# Patient Record
Sex: Male | Born: 1980 | Race: Black or African American | Hispanic: No | Marital: Single | Smoking: Never smoker
Health system: Southern US, Community
[De-identification: ages and names within clinical notes are randomized; demographics above are authoritative.]

## PROBLEM LIST (undated history)

## (undated) DIAGNOSIS — N189 Chronic kidney disease, unspecified: Secondary | ICD-10-CM

## (undated) DIAGNOSIS — I10 Essential (primary) hypertension: Secondary | ICD-10-CM

## (undated) DIAGNOSIS — T8859XA Other complications of anesthesia, initial encounter: Secondary | ICD-10-CM

## (undated) DIAGNOSIS — Z9289 Personal history of other medical treatment: Secondary | ICD-10-CM

## (undated) DIAGNOSIS — T4145XA Adverse effect of unspecified anesthetic, initial encounter: Secondary | ICD-10-CM

## (undated) HISTORY — DX: Chronic kidney disease, unspecified: N18.9

## (undated) HISTORY — PX: ARTERIOVENOUS GRAFT PLACEMENT: SUR1029

## (undated) HISTORY — DX: Essential (primary) hypertension: I10

## (undated) HISTORY — PX: INSERTION OF DIALYSIS CATHETER: SHX1324

## (undated) HISTORY — PX: OTHER SURGICAL HISTORY: SHX169

## (undated) HISTORY — PX: TONSILLECTOMY: SUR1361

## (undated) HISTORY — PX: AV FISTULA PLACEMENT: SHX1204

---

## 2018-04-22 ENCOUNTER — Other Ambulatory Visit: Payer: Self-pay

## 2018-04-22 DIAGNOSIS — N185 Chronic kidney disease, stage 5: Secondary | ICD-10-CM

## 2018-04-28 ENCOUNTER — Encounter: Payer: Self-pay | Admitting: *Deleted

## 2018-04-28 ENCOUNTER — Encounter: Payer: Self-pay | Admitting: Surgery

## 2018-04-28 ENCOUNTER — Ambulatory Visit (INDEPENDENT_AMBULATORY_CARE_PROVIDER_SITE_OTHER): Payer: Medicare Other | Admitting: Surgery

## 2018-04-28 ENCOUNTER — Ambulatory Visit (INDEPENDENT_AMBULATORY_CARE_PROVIDER_SITE_OTHER)
Admission: RE | Admit: 2018-04-28 | Discharge: 2018-04-28 | Disposition: A | Discharge status: Home / Self Care | Payer: Medicare Other | Source: Ambulatory Visit | Attending: Family | Admitting: Family

## 2018-04-28 ENCOUNTER — Ambulatory Visit (HOSPITAL_COMMUNITY)
Admission: RE | Admit: 2018-04-28 | Discharge: 2018-04-28 | Disposition: A | Discharge status: Home / Self Care | Payer: Medicare Other | Source: Ambulatory Visit | Attending: Family | Admitting: Family

## 2018-04-28 ENCOUNTER — Other Ambulatory Visit: Payer: Self-pay

## 2018-04-28 VITALS — BP 156/107 | HR 71 | Temp 97.6°F | Resp 20 | Ht 65.0 in | Wt 258.7 lb

## 2018-04-28 DIAGNOSIS — N186 End stage renal disease: Secondary | ICD-10-CM

## 2018-04-28 DIAGNOSIS — N185 Chronic kidney disease, stage 5: Secondary | ICD-10-CM | POA: Insufficient documentation

## 2018-04-28 DIAGNOSIS — Z992 Dependence on renal dialysis: Secondary | ICD-10-CM

## 2018-04-28 NOTE — H&P (View-Only) (Signed)
Vascular and Vein Specialist of [facility]  Patient name: John Petty MRN: [id number] DOB: 02-Jan-1981 Sex: male   REQUESTING PROVIDER:    Dr. Moshe Cipro   REASON FOR CONSULT:    Dialysis access  HISTORY OF PRESENT ILLNESS:   John Petty is a 38 y.o. male, who is referred today for dialysis access.  The patient has been on dialysis and on for [place].  According to him he is running out of options for upper extremity access.  He states that he is undergone bilateral upper extremity venogram and there was discussions on whether or not he should have a HeRO graft placed.  He moved to [place] and so none of these records are here with him today.  He currently has a right thigh graft in place  The patient is medically managed for hypertension.  He is on dialysis Tuesday Thursday Saturday.  PAST MEDICAL HISTORY    Past Medical History:  Diagnosis Date  . Chronic kidney disease   . Hypertension      FAMILY HISTORY   History reviewed. No pertinent family history.  SOCIAL HISTORY:   Social History   Socioeconomic History  . Marital status: Single    Spouse name: Not on file  . Number of children: Not on file  . Years of education: Not on file  . Highest education level: Not on file  Occupational History  . Not on file  Social Needs  . Financial resource strain: Not on file  . Food insecurity:    Worry: Not on file    Inability: Not on file  . Transportation needs:    Medical: Not on file    Non-medical: Not on file  Tobacco Use  . Smoking status: Never Smoker  . Smokeless tobacco: Never Used  Substance and Sexual Activity  . Alcohol use: Yes    Comment: occasional  . Drug use: Not Currently  . Sexual activity: Not on file  Lifestyle  . Physical activity:    Days per week: Not on file    Minutes per session: Not on file  . Stress: Not on file  Relationships  . Social connections:    Talks on phone: Not on  file    Gets together: Not on file    Attends religious service: Not on file    Active member of club or organization: Not on file    Attends meetings of clubs or organizations: Not on file    Relationship status: Not on file  . Intimate partner violence:    Fear of current or ex partner: Not on file    Emotionally abused: Not on file    Physically abused: Not on file    Forced sexual activity: Not on file  Other Topics Concern  . Not on file  Social History Narrative  . Not on file    ALLERGIES:    No Known Allergies  CURRENT MEDICATIONS:    Current Outpatient Medications  Medication Sig Dispense Refill  . amLODipine (NORVASC) 5 MG tablet Take by mouth.    Marland Kitchen lisinopril (PRINIVIL,ZESTRIL) 10 MG tablet      No current facility-administered medications for this visit.     REVIEW OF SYSTEMS:   [X]  denotes positive finding, [ ]  denotes negative finding Cardiac  Comments:  Chest pain or chest pressure:    Shortness of breath upon exertion:    Short of breath when lying flat:    Irregular heart rhythm:  Vascular    Pain in calf, thigh, or hip brought on by ambulation:    Pain in feet at night that wakes you up from your sleep:     Blood clot in your veins:    Leg swelling:         Pulmonary    Oxygen at home:    Productive cough:     Wheezing:         Neurologic    Sudden weakness in arms or legs:     Sudden numbness in arms or legs:     Sudden onset of difficulty speaking or slurred speech:    Temporary loss of vision in one eye:     Problems with dizziness:         Gastrointestinal    Blood in stool:      Vomited blood:         Genitourinary    Burning when urinating:     Blood in urine:        Psychiatric    Major depression:         Hematologic    Bleeding problems:    Problems with blood clotting too easily:        Skin    Rashes or ulcers:        Constitutional    Fever or chills:     PHYSICAL EXAM:   Vitals:   04/28/18 1529    BP: (!) 156/107  Pulse: 71  Resp: 20  Temp: 97.6 F (36.4 C)  SpO2: 100%  Weight: 258 lb 11.4 oz (117.4 kg)  Height: 5\' 5"  (1.651 m)    GENERAL: The patient is a well-nourished male, in no acute distress. The vital signs are documented above. CARDIAC: There is a regular rate and rhythm.  VASCULAR: Brisk posterior tibial Doppler signal on the left.  Minimal edema. PULMONARY: Nonlabored respirations ABDOMEN: Soft and non-tender with normal pitched bowel sounds.  MUSCULOSKELETAL: There are no major deformities or cyanosis. NEUROLOGIC: No focal weakness or paresthesias are detected. SKIN: There are no ulcers or rashes noted. PSYCHIATRIC: The patient has a normal affect.  STUDIES:   I have ordered and reviewed his vascular lab studies with the following findings: Triphasic arterial waveforms; Right Cephalic   Diameter (cm)Depth (cm)Findings +-----------------+-------------+----------+--------+ Shoulder             0.35                        +-----------------+-------------+----------+--------+ Prox upper arm       0.35                        +-----------------+-------------+----------+--------+ Mid upper arm        0.27                        +-----------------+-------------+----------+--------+ Dist upper arm    0.27 / .31                     +-----------------+-------------+----------+--------+ Antecubital fossa    0.26                        +-----------------+-------------+----------+--------+ Prox forearm         0.24                        +-----------------+-------------+----------+--------+ Mid forearm  0.19                        +-----------------+-------------+----------+--------+ Dist forearm         0.24                        +-----------------+-------------+----------+--------+  +-----------------+-------------+----------+-------------------+ Right Basilic    Diameter (cm)Depth (cm)     Findings        +-----------------+-------------+----------+-------------------+ Prox upper arm                                 graft        +-----------------+-------------+----------+-------------------+ Mid upper arm        0.49               graft and branching +-----------------+-------------+----------+-------------------+ Dist upper arm       0.32               graft and branching +-----------------+-------------+----------+-------------------+ Antecubital fossa    0.20                                   +-----------------+-------------+----------+-------------------+ Prox forearm                                 too small      +-----------------+-------------+----------+-------------------+  +-----------------+-------------+----------+--------------+ Left Cephalic    Diameter (cm)Depth (cm)   Findings    +-----------------+-------------+----------+--------------+ Prox upper arm                             thrombus    +-----------------+-------------+----------+--------------+ Mid upper arm                              thrombus    +-----------------+-------------+----------+--------------+ Dist upper arm                             thrombus    +-----------------+-------------+----------+--------------+ Antecubital fossa                       not visualized +-----------------+-------------+----------+--------------+ Prox forearm         0.13                              +-----------------+-------------+----------+--------------+ Mid forearm       0.12 / .21                           +-----------------+-------------+----------+--------------+ Dist forearm         0.20                              +-----------------+-------------+----------+--------------+  +-----------------+-------------+----------+--------------------+ Left Basilic     Diameter (cm)Depth (cm)      Findings        +-----------------+-------------+----------+--------------------+ Prox upper arm                             not visualized    +-----------------+-------------+----------+--------------------+  Mid upper arm        0.40               mis to distal. joins +-----------------+-------------+----------+--------------------+ Dist upper arm       0.41                                    +-----------------+-------------+----------+--------------------+ Antecubital fossa    0.35                                    +-----------------+-------------+----------+--------------------+ Prox forearm         0.26                                    +-----------------+-------------+----------+--------------------+  ASSESSMENT and PLAN   ESRD: This is a somewhat challenging management problem as the patient has [place] accesses [place] and potentially was being evaluated for a HeRO graft, however he is not sure if he was a candidate for this.  Therefore in order to expedite his care I am placing him on the operating room schedule for Wednesday, March 11.  My initial plan is to get records from Dr. Harrington Challenger this week so I can determine what type of HeRO location was being considered.  I will evaluate him for this in the operating room and place it if possible.  If this is not possible, I would proceed with a left thigh Gore-Tex graft.  All patient questions answered and he is in agreement with this plan.  Annamarie Major, MD V[facility] and Vein Specialists of [facility] [phone] Pager [phone]

## 2018-04-28 NOTE — Progress Notes (Signed)
Vascular and Vein Specialist of [facility]  Patient name: John Petty MRN: [id number] DOB: [birth year] Sex: male   REQUESTING PROVIDER:    Dr. Moshe Cipro   REASON FOR CONSULT:    Dialysis access  HISTORY OF PRESENT ILLNESS:   John Petty is a 38 y.o. male, who is referred today for dialysis access.  The patient has been on dialysis and on for [place].  According to him he is running out of options for upper extremity access.  He states that he is undergone bilateral upper extremity venogram and there was discussions on whether or not he should have a HeRO graft placed.  He moved to [place] and so none of these records are here with him today.  He currently has a right thigh graft in place  The patient is medically managed for hypertension.  He is on dialysis Tuesday Thursday Saturday.  PAST MEDICAL HISTORY    Past Medical History:  Diagnosis Date  . Chronic kidney disease   . Hypertension      FAMILY HISTORY   History reviewed. No pertinent family history.  SOCIAL HISTORY:   Social History   Socioeconomic History  . Marital status: Single    Spouse name: Not on file  . Number of children: Not on file  . Years of education: Not on file  . Highest education level: Not on file  Occupational History  . Not on file  Social Needs  . Financial resource strain: Not on file  . Food insecurity:    Worry: Not on file    Inability: Not on file  . Transportation needs:    Medical: Not on file    Non-medical: Not on file  Tobacco Use  . Smoking status: Never Smoker  . Smokeless tobacco: Never Used  Substance and Sexual Activity  . Alcohol use: Yes    Comment: occasional  . Drug use: Not Currently  . Sexual activity: Not on file  Lifestyle  . Physical activity:    Days per week: Not on file    Minutes per session: Not on file  . Stress: Not on file  Relationships  . Social connections:    Talks on phone: Not on  file    Gets together: Not on file    Attends religious service: Not on file    Active member of club or organization: Not on file    Attends meetings of clubs or organizations: Not on file    Relationship status: Not on file  . Intimate partner violence:    Fear of current or ex partner: Not on file    Emotionally abused: Not on file    Physically abused: Not on file    Forced sexual activity: Not on file  Other Topics Concern  . Not on file  Social History Narrative  . Not on file    ALLERGIES:    No Known Allergies  CURRENT MEDICATIONS:    Current Outpatient Medications  Medication Sig Dispense Refill  . amLODipine (NORVASC) 5 MG tablet Take by mouth.    Marland Kitchen lisinopril (PRINIVIL,ZESTRIL) 10 MG tablet      No current facility-administered medications for this visit.     REVIEW OF SYSTEMS:   [X]  denotes positive finding, [ ]  denotes negative finding Cardiac  Comments:  Chest pain or chest pressure:    Shortness of breath upon exertion:    Short of breath when lying flat:    Irregular heart rhythm:  Vascular    Pain in calf, thigh, or hip brought on by ambulation:    Pain in feet at night that wakes you up from your sleep:     Blood clot in your veins:    Leg swelling:         Pulmonary    Oxygen at home:    Productive cough:     Wheezing:         Neurologic    Sudden weakness in arms or legs:     Sudden numbness in arms or legs:     Sudden onset of difficulty speaking or slurred speech:    Temporary loss of vision in one eye:     Problems with dizziness:         Gastrointestinal    Blood in stool:      Vomited blood:         Genitourinary    Burning when urinating:     Blood in urine:        Psychiatric    Major depression:         Hematologic    Bleeding problems:    Problems with blood clotting too easily:        Skin    Rashes or ulcers:        Constitutional    Fever or chills:     PHYSICAL EXAM:   Vitals:   04/28/18 1529    BP: (!) 156/107  Pulse: 71  Resp: 20  Temp: 97.6 F (36.4 C)  SpO2: 100%  Weight: 258 lb 11.4 oz (117.4 kg)  Height: 5\' 5"  (1.651 m)    GENERAL: The patient is a well-nourished male, in no acute distress. The vital signs are documented above. CARDIAC: There is a regular rate and rhythm.  VASCULAR: Brisk posterior tibial Doppler signal on the left.  Minimal edema. PULMONARY: Nonlabored respirations ABDOMEN: Soft and non-tender with normal pitched bowel sounds.  MUSCULOSKELETAL: There are no major deformities or cyanosis. NEUROLOGIC: No focal weakness or paresthesias are detected. SKIN: There are no ulcers or rashes noted. PSYCHIATRIC: The patient has a normal affect.  STUDIES:   I have ordered and reviewed his vascular lab studies with the following findings: Triphasic arterial waveforms; Right Cephalic   Diameter (cm)Depth (cm)Findings +-----------------+-------------+----------+--------+ Shoulder             0.35                        +-----------------+-------------+----------+--------+ Prox upper arm       0.35                        +-----------------+-------------+----------+--------+ Mid upper arm        0.27                        +-----------------+-------------+----------+--------+ Dist upper arm    0.27 / .31                     +-----------------+-------------+----------+--------+ Antecubital fossa    0.26                        +-----------------+-------------+----------+--------+ Prox forearm         0.24                        +-----------------+-------------+----------+--------+ Mid forearm  0.19                        +-----------------+-------------+----------+--------+ Dist forearm         0.24                        +-----------------+-------------+----------+--------+  +-----------------+-------------+----------+-------------------+ Right Basilic    Diameter (cm)Depth (cm)     Findings        +-----------------+-------------+----------+-------------------+ Prox upper arm                                 graft        +-----------------+-------------+----------+-------------------+ Mid upper arm        0.49               graft and branching +-----------------+-------------+----------+-------------------+ Dist upper arm       0.32               graft and branching +-----------------+-------------+----------+-------------------+ Antecubital fossa    0.20                                   +-----------------+-------------+----------+-------------------+ Prox forearm                                 too small      +-----------------+-------------+----------+-------------------+  +-----------------+-------------+----------+--------------+ Left Cephalic    Diameter (cm)Depth (cm)   Findings    +-----------------+-------------+----------+--------------+ Prox upper arm                             thrombus    +-----------------+-------------+----------+--------------+ Mid upper arm                              thrombus    +-----------------+-------------+----------+--------------+ Dist upper arm                             thrombus    +-----------------+-------------+----------+--------------+ Antecubital fossa                       not visualized +-----------------+-------------+----------+--------------+ Prox forearm         0.13                              +-----------------+-------------+----------+--------------+ Mid forearm       0.12 / .21                           +-----------------+-------------+----------+--------------+ Dist forearm         0.20                              +-----------------+-------------+----------+--------------+  +-----------------+-------------+----------+--------------------+ Left Basilic     Diameter (cm)Depth (cm)      Findings        +-----------------+-------------+----------+--------------------+ Prox upper arm                             not visualized    +-----------------+-------------+----------+--------------------+  Mid upper arm        0.40               mis to distal. joins +-----------------+-------------+----------+--------------------+ Dist upper arm       0.41                                    +-----------------+-------------+----------+--------------------+ Antecubital fossa    0.35                                    +-----------------+-------------+----------+--------------------+ Prox forearm         0.26                                    +-----------------+-------------+----------+--------------------+  ASSESSMENT and PLAN   ESRD: This is a somewhat challenging management problem as the patient has had multiple accesses [place] and potentially was being evaluated for a HeRO graft, however he is not sure if he was a candidate for this.  Therefore in order to expedite his care I am placing him on the operating room schedule for Wednesday, March 11.  My initial plan is to get records from Dr. Harrington Challenger this week so I can determine what type of HeRO location was being considered.  I will evaluate him for this in the operating room and place it if possible.  If this is not possible, I would proceed with a left thigh Gore-Tex graft.  All patient questions answered and he is in agreement with this plan.  Annamarie Major, MD Vascular and Vein Specialists of [facility] [phone] Pager [phone]

## 2018-05-02 ENCOUNTER — Other Ambulatory Visit (HOSPITAL_COMMUNITY): Payer: Medicare Other

## 2018-05-02 ENCOUNTER — Encounter: Payer: Medicare Other | Admitting: Vascular Surgery

## 2018-05-02 ENCOUNTER — Encounter (HOSPITAL_COMMUNITY): Payer: Medicare Other

## 2018-05-05 ENCOUNTER — Encounter (HOSPITAL_COMMUNITY): Payer: Self-pay | Admitting: *Deleted

## 2018-05-06 ENCOUNTER — Other Ambulatory Visit: Payer: Self-pay | Admitting: *Deleted

## 2018-05-06 ENCOUNTER — Other Ambulatory Visit: Payer: Self-pay

## 2018-05-06 ENCOUNTER — Encounter (HOSPITAL_COMMUNITY): Payer: Self-pay | Admitting: *Deleted

## 2018-05-06 MED ORDER — DEXTROSE 5 % IV SOLN
3.0000 g | INTRAVENOUS | Status: AC
  Administered 2018-05-07: 3 g via INTRAVENOUS
  Filled 2018-05-06: qty 3

## 2018-05-06 NOTE — Progress Notes (Signed)
Mr Voorhies denies chest pain or shortness of breath.  Mr Cuffee moved from [place], 02/25/2018.  I have requested records from [facility], asking for an EKG, possibly from [place]. Mr Randle said if he had an EKG he felt it would have been at [facility]  [facility] [place], I called,they have not seen patient since 2017- they are outpatient surgery and do not  have an EKG. Mr Veltre denies ever  Having an ECHO , Stress Test or Cardiac Cath. Mr Richert is working on transportation home and someone to stay with him 1st 24 hours after surgery.

## 2018-05-07 ENCOUNTER — Encounter (HOSPITAL_COMMUNITY): Payer: Self-pay

## 2018-05-07 ENCOUNTER — Encounter (HOSPITAL_COMMUNITY)
Admission: RE | Disposition: A | Discharge status: Home / Self Care | Payer: Self-pay | Source: Home / Self Care | Attending: Surgery

## 2018-05-07 ENCOUNTER — Observation Stay (HOSPITAL_COMMUNITY)
Admission: RE | Admit: 2018-05-07 | Discharge: 2018-05-08 | Disposition: A | Discharge status: Home / Self Care | Payer: Medicare Other | Attending: Surgery | Admitting: Surgery

## 2018-05-07 ENCOUNTER — Ambulatory Visit (HOSPITAL_COMMUNITY): Payer: Medicare Other | Admitting: Anesthesiology

## 2018-05-07 DIAGNOSIS — N185 Chronic kidney disease, stage 5: Secondary | ICD-10-CM | POA: Diagnosis not present

## 2018-05-07 DIAGNOSIS — I120 Hypertensive chronic kidney disease with stage 5 chronic kidney disease or end stage renal disease: Principal | ICD-10-CM | POA: Insufficient documentation

## 2018-05-07 DIAGNOSIS — Z79899 Other long term (current) drug therapy: Secondary | ICD-10-CM | POA: Diagnosis not present

## 2018-05-07 DIAGNOSIS — Z992 Dependence on renal dialysis: Secondary | ICD-10-CM | POA: Insufficient documentation

## 2018-05-07 DIAGNOSIS — N186 End stage renal disease: Secondary | ICD-10-CM | POA: Insufficient documentation

## 2018-05-07 HISTORY — PX: AV FISTULA PLACEMENT: SHX1204

## 2018-05-07 HISTORY — DX: Personal history of other medical treatment: Z92.89

## 2018-05-07 HISTORY — DX: Other complications of anesthesia, initial encounter: T88.59XA

## 2018-05-07 HISTORY — DX: Adverse effect of unspecified anesthetic, initial encounter: T41.45XA

## 2018-05-07 LAB — CBC
HCT: 39.2 % (ref 39.0–52.0)
Hemoglobin: 11.1 g/dL — ABNORMAL LOW (ref 13.0–17.0)
MCH: 25.3 pg — AB (ref 26.0–34.0)
MCHC: 28.3 g/dL — ABNORMAL LOW (ref 30.0–36.0)
MCV: 89.3 fL (ref 80.0–100.0)
Platelets: 243 10*3/uL (ref 150–400)
RBC: 4.39 MIL/uL (ref 4.22–5.81)
RDW: 18 % — ABNORMAL HIGH (ref 11.5–15.5)
WBC: 11.3 10*3/uL — ABNORMAL HIGH (ref 4.0–10.5)
nRBC: 0 % (ref 0.0–0.2)

## 2018-05-07 LAB — POCT I-STAT 4, (NA,K, GLUC, HGB,HCT)
Glucose, Bld: 93 mg/dL (ref 70–99)
HCT: 38 % — ABNORMAL LOW (ref 39.0–52.0)
Hemoglobin: 12.9 g/dL — ABNORMAL LOW (ref 13.0–17.0)
Potassium: 4.4 mmol/L (ref 3.5–5.1)
Sodium: 138 mmol/L (ref 135–145)

## 2018-05-07 LAB — CREATININE, SERUM
Creatinine, Ser: 9.24 mg/dL — ABNORMAL HIGH (ref 0.61–1.24)
GFR calc Af Amer: 7 mL/min — ABNORMAL LOW (ref >60)
GFR calc non Af Amer: 6 mL/min — ABNORMAL LOW (ref >60)

## 2018-05-07 SURGERY — INSERTION OF ARTERIOVENOUS (AV) GORE-TEX GRAFT THIGH
Anesthesia: General | Site: Thigh | Laterality: Left

## 2018-05-07 MED ORDER — PHENYLEPHRINE 40 MCG/ML (10ML) SYRINGE FOR IV PUSH (FOR BLOOD PRESSURE SUPPORT)
PREFILLED_SYRINGE | INTRAVENOUS | Status: AC
  Filled 2018-05-07: qty 10

## 2018-05-07 MED ORDER — DEXAMETHASONE SODIUM PHOSPHATE 10 MG/ML IJ SOLN
INTRAMUSCULAR | Status: AC
  Filled 2018-05-07: qty 1

## 2018-05-07 MED ORDER — 0.9 % SODIUM CHLORIDE (POUR BTL) OPTIME
TOPICAL | Status: DC | PRN
  Administered 2018-05-07: 2000 mL

## 2018-05-07 MED ORDER — PHENYLEPHRINE HCL 10 MG/ML IJ SOLN
INTRAMUSCULAR | Status: DC | PRN
  Administered 2018-05-07 (×5): 80 ug via INTRAVENOUS

## 2018-05-07 MED ORDER — FENTANYL CITRATE (PF) 250 MCG/5ML IJ SOLN
INTRAMUSCULAR | Status: AC
  Filled 2018-05-07: qty 5

## 2018-05-07 MED ORDER — LABETALOL HCL 5 MG/ML IV SOLN
10.0000 mg | INTRAVENOUS | Status: DC | PRN
  Administered 2018-05-07: 10 mg via INTRAVENOUS
  Filled 2018-05-07: qty 4

## 2018-05-07 MED ORDER — CEFAZOLIN SODIUM-DEXTROSE 2-4 GM/100ML-% IV SOLN
INTRAVENOUS | Status: AC
  Filled 2018-05-07: qty 100

## 2018-05-07 MED ORDER — FENTANYL CITRATE (PF) 100 MCG/2ML IJ SOLN
INTRAMUSCULAR | Status: AC
  Administered 2018-05-07: 50 ug via INTRAVENOUS
  Filled 2018-05-07: qty 2

## 2018-05-07 MED ORDER — PROPOFOL 10 MG/ML IV BOLUS
INTRAVENOUS | Status: DC | PRN
  Administered 2018-05-07: 50 mg via INTRAVENOUS
  Administered 2018-05-07: 200 mg via INTRAVENOUS
  Administered 2018-05-07: 50 mg via INTRAVENOUS

## 2018-05-07 MED ORDER — METOPROLOL TARTRATE 5 MG/5ML IV SOLN: 2.0000 mg | INTRAVENOUS | Status: DC | PRN

## 2018-05-07 MED ORDER — CEFAZOLIN SODIUM 1 G IJ SOLR
INTRAMUSCULAR | Status: AC
  Filled 2018-05-07: qty 30

## 2018-05-07 MED ORDER — HEMOSTATIC AGENTS (NO CHARGE) OPTIME
TOPICAL | Status: DC | PRN
  Administered 2018-05-07: 1 via TOPICAL

## 2018-05-07 MED ORDER — OXYCODONE HCL 5 MG/5ML PO SOLN: 5.0000 mg | Freq: Once | ORAL | Status: AC | PRN

## 2018-05-07 MED ORDER — ONDANSETRON HCL 4 MG/2ML IJ SOLN: 4.0000 mg | Freq: Four times a day (QID) | INTRAMUSCULAR | Status: DC | PRN

## 2018-05-07 MED ORDER — SODIUM CHLORIDE 0.9 % IV SOLN
INTRAVENOUS | Status: AC
  Filled 2018-05-07: qty 1.2

## 2018-05-07 MED ORDER — PROTAMINE SULFATE 10 MG/ML IV SOLN
INTRAVENOUS | Status: DC | PRN
  Administered 2018-05-07: 25 mg via INTRAVENOUS

## 2018-05-07 MED ORDER — SODIUM CHLORIDE 0.9 % IV SOLN: 500.0000 mL | Freq: Once | INTRAVENOUS | Status: DC | PRN

## 2018-05-07 MED ORDER — DEXAMETHASONE SODIUM PHOSPHATE 10 MG/ML IJ SOLN
INTRAMUSCULAR | Status: DC | PRN
  Administered 2018-05-07: 10 mg via INTRAVENOUS

## 2018-05-07 MED ORDER — MIDAZOLAM HCL 5 MG/5ML IJ SOLN
INTRAMUSCULAR | Status: DC | PRN
  Administered 2018-05-07: 2 mg via INTRAVENOUS

## 2018-05-07 MED ORDER — HEPARIN SODIUM (PORCINE) 5000 UNIT/ML IJ SOLN
5000.0000 [IU] | Freq: Three times a day (TID) | INTRAMUSCULAR | Status: DC
  Administered 2018-05-07 – 2018-05-08 (×3): 5000 [IU] via SUBCUTANEOUS
  Filled 2018-05-07 (×4): qty 1

## 2018-05-07 MED ORDER — EPHEDRINE SULFATE 50 MG/ML IJ SOLN
INTRAMUSCULAR | Status: DC | PRN
  Administered 2018-05-07 (×4): 10 mg via INTRAVENOUS

## 2018-05-07 MED ORDER — PROTAMINE SULFATE 10 MG/ML IV SOLN
INTRAVENOUS | Status: AC
  Filled 2018-05-07: qty 5

## 2018-05-07 MED ORDER — CEFAZOLIN SODIUM-DEXTROSE 2-4 GM/100ML-% IV SOLN
2.0000 g | Freq: Once | INTRAVENOUS | Status: AC
  Administered 2018-05-08: 2 g via INTRAVENOUS
  Filled 2018-05-07: qty 100

## 2018-05-07 MED ORDER — ONDANSETRON HCL 4 MG/2ML IJ SOLN
INTRAMUSCULAR | Status: AC
  Filled 2018-05-07: qty 2

## 2018-05-07 MED ORDER — EPHEDRINE 5 MG/ML INJ
INTRAVENOUS | Status: AC
  Filled 2018-05-07: qty 10

## 2018-05-07 MED ORDER — MIDAZOLAM HCL 2 MG/2ML IJ SOLN
INTRAMUSCULAR | Status: AC
  Filled 2018-05-07: qty 2

## 2018-05-07 MED ORDER — GUAIFENESIN-DM 100-10 MG/5ML PO SYRP: 15.0000 mL | ORAL_SOLUTION | ORAL | Status: DC | PRN

## 2018-05-07 MED ORDER — PHENOL 1.4 % MT LIQD: 1.0000 | OROMUCOSAL | Status: DC | PRN

## 2018-05-07 MED ORDER — FENTANYL CITRATE (PF) 100 MCG/2ML IJ SOLN
INTRAMUSCULAR | Status: DC | PRN
  Administered 2018-05-07 (×5): 50 ug via INTRAVENOUS

## 2018-05-07 MED ORDER — HYDRALAZINE HCL 20 MG/ML IJ SOLN: 5.0000 mg | INTRAMUSCULAR | Status: DC | PRN

## 2018-05-07 MED ORDER — ACETAMINOPHEN 325 MG RE SUPP: 325.0000 mg | RECTAL | Status: DC | PRN

## 2018-05-07 MED ORDER — OXYCODONE HCL 5 MG PO TABS
5.0000 mg | ORAL_TABLET | Freq: Once | ORAL | Status: AC | PRN
  Administered 2018-05-07: 5 mg via ORAL

## 2018-05-07 MED ORDER — ACETAMINOPHEN 325 MG PO TABS: 325.0000 mg | ORAL_TABLET | ORAL | Status: DC | PRN

## 2018-05-07 MED ORDER — OXYCODONE-ACETAMINOPHEN 5-325 MG PO TABS
1.0000 | ORAL_TABLET | ORAL | Status: DC | PRN
  Administered 2018-05-07 – 2018-05-08 (×3): 2 via ORAL
  Filled 2018-05-07 (×3): qty 2

## 2018-05-07 MED ORDER — CHLORHEXIDINE GLUCONATE 4 % EX LIQD: 60.0000 mL | Freq: Once | CUTANEOUS | Status: DC

## 2018-05-07 MED ORDER — SODIUM CHLORIDE 0.9 % IV SOLN
INTRAVENOUS | Status: DC | PRN
  Administered 2018-05-07: 30 ug/min via INTRAVENOUS

## 2018-05-07 MED ORDER — FENTANYL CITRATE (PF) 100 MCG/2ML IJ SOLN
25.0000 ug | INTRAMUSCULAR | Status: DC | PRN
  Administered 2018-05-07: 50 ug via INTRAVENOUS

## 2018-05-07 MED ORDER — SODIUM CHLORIDE 0.9 % IV SOLN
INTRAVENOUS | Status: DC | PRN
  Administered 2018-05-07: 08:00:00

## 2018-05-07 MED ORDER — AMLODIPINE BESYLATE 5 MG PO TABS
5.0000 mg | ORAL_TABLET | Freq: Every day | ORAL | Status: DC
  Administered 2018-05-07 – 2018-05-08 (×2): 5 mg via ORAL
  Filled 2018-05-07 (×3): qty 1

## 2018-05-07 MED ORDER — SEVELAMER CARBONATE 800 MG PO TABS
800.0000 mg | ORAL_TABLET | Freq: Three times a day (TID) | ORAL | Status: DC
  Administered 2018-05-07 – 2018-05-08 (×3): 800 mg via ORAL
  Filled 2018-05-07 (×3): qty 1

## 2018-05-07 MED ORDER — HEPARIN SODIUM (PORCINE) 1000 UNIT/ML IJ SOLN
INTRAMUSCULAR | Status: DC | PRN
  Administered 2018-05-07: 5000 [IU] via INTRAVENOUS

## 2018-05-07 MED ORDER — OXYCODONE HCL 5 MG PO TABS
ORAL_TABLET | ORAL | Status: AC
  Administered 2018-05-07: 5 mg via ORAL
  Filled 2018-05-07: qty 1

## 2018-05-07 MED ORDER — MORPHINE SULFATE (PF) 2 MG/ML IV SOLN: 2.0000 mg | INTRAVENOUS | Status: DC | PRN

## 2018-05-07 MED ORDER — HEPARIN SODIUM (PORCINE) 1000 UNIT/ML IJ SOLN
INTRAMUSCULAR | Status: AC
  Filled 2018-05-07: qty 1

## 2018-05-07 MED ORDER — PANTOPRAZOLE SODIUM 40 MG PO TBEC
40.0000 mg | DELAYED_RELEASE_TABLET | Freq: Every day | ORAL | Status: DC
  Administered 2018-05-08: 40 mg via ORAL
  Filled 2018-05-07: qty 1

## 2018-05-07 MED ORDER — ONDANSETRON HCL 4 MG/2ML IJ SOLN
INTRAMUSCULAR | Status: DC | PRN
  Administered 2018-05-07: 4 mg via INTRAVENOUS

## 2018-05-07 MED ORDER — PROPOFOL 10 MG/ML IV BOLUS
INTRAVENOUS | Status: AC
  Filled 2018-05-07: qty 40

## 2018-05-07 MED ORDER — DOCUSATE SODIUM 100 MG PO CAPS
100.0000 mg | ORAL_CAPSULE | Freq: Every day | ORAL | Status: DC
  Administered 2018-05-08: 100 mg via ORAL
  Filled 2018-05-07: qty 1

## 2018-05-07 MED ORDER — SODIUM CHLORIDE 0.9 % IV SOLN
INTRAVENOUS | Status: DC
  Administered 2018-05-07: 08:00:00 via INTRAVENOUS

## 2018-05-07 SURGICAL SUPPLY — 39 items
CANISTER SUCT 3000ML PPV (MISCELLANEOUS) ×3 IMPLANT
CLIP VESOCCLUDE MED 6/CT (CLIP) ×3 IMPLANT
CLIP VESOCCLUDE SM WIDE 6/CT (CLIP) ×3 IMPLANT
DERMABOND ADVANCED (GAUZE/BANDAGES/DRESSINGS) ×4
DERMABOND ADVANCED .7 DNX12 (GAUZE/BANDAGES/DRESSINGS) ×1 IMPLANT
DRAPE INCISE IOBAN 66X45 STRL (DRAPES) ×3 IMPLANT
ELECT CAUTERY BLADE 6.4 (BLADE) ×2 IMPLANT
ELECT REM PT RETURN 9FT ADLT (ELECTROSURGICAL) ×3
ELECTRODE REM PT RTRN 9FT ADLT (ELECTROSURGICAL) ×1 IMPLANT
GLOVE BIOGEL PI IND STRL 7.5 (GLOVE) ×1 IMPLANT
GLOVE BIOGEL PI INDICATOR 7.5 (GLOVE) ×2
GLOVE SURG SS PI 7.5 STRL IVOR (GLOVE) ×3 IMPLANT
GOWN STRL REUS W/ TWL LRG LVL3 (GOWN DISPOSABLE) ×2 IMPLANT
GOWN STRL REUS W/ TWL XL LVL3 (GOWN DISPOSABLE) ×1 IMPLANT
GOWN STRL REUS W/TWL LRG LVL3 (GOWN DISPOSABLE) ×2
GOWN STRL REUS W/TWL XL LVL3 (GOWN DISPOSABLE) ×4
GRAFT GORETEX STRT 6X50 (Vascular Products) ×2 IMPLANT
HEMOSTAT SNOW SURGICEL 2X4 (HEMOSTASIS) ×2 IMPLANT
KIT BASIN OR (CUSTOM PROCEDURE TRAY) ×3 IMPLANT
KIT TURNOVER KIT B (KITS) ×3 IMPLANT
NS IRRIG 1000ML POUR BTL (IV SOLUTION) ×5 IMPLANT
PACK CV ACCESS (CUSTOM PROCEDURE TRAY) ×3 IMPLANT
PAD ARMBOARD 7.5X6 YLW CONV (MISCELLANEOUS) ×6 IMPLANT
PENCIL BUTTON HOLSTER BLD 10FT (ELECTRODE) ×2 IMPLANT
SPONGE LAP 18X18 RF (DISPOSABLE) ×2 IMPLANT
SUT PROLENE 5 0 C 1 36 (SUTURE) ×2 IMPLANT
SUT PROLENE 6 0 BV (SUTURE) ×9 IMPLANT
SUT SILK 2 0 SH (SUTURE) ×2 IMPLANT
SUT SILK 3 0 (SUTURE) ×2
SUT SILK 3-0 18XBRD TIE 12 (SUTURE) IMPLANT
SUT VIC AB 2-0 CT1 27 (SUTURE) ×2
SUT VIC AB 2-0 CT1 TAPERPNT 27 (SUTURE) IMPLANT
SUT VIC AB 3-0 SH 27 (SUTURE) ×4
SUT VIC AB 3-0 SH 27X BRD (SUTURE) ×1 IMPLANT
SUT VIC AB 4-0 PS2 18 (SUTURE) ×2 IMPLANT
SUT VICRYL 4-0 PS2 18IN ABS (SUTURE) ×3 IMPLANT
TOWEL GREEN STERILE (TOWEL DISPOSABLE) ×3 IMPLANT
UNDERPAD 30X30 (UNDERPADS AND DIAPERS) ×1 IMPLANT
WATER STERILE IRR 1000ML POUR (IV SOLUTION) ×3 IMPLANT

## 2018-05-07 NOTE — Consult Note (Addendum)
[facility] Nurse wound consult note Reason for Consult: numerous lesions in several body locations:  Posterior neck, axillary, inframammary, subpannicular and bilateral inguinal Wound type: inflammatory, immunosuppressive lesions, suspicious for hidradenitis suppurativa (presentation is consistent with) Pressure Injury POA: NA Measurement: numerous lesions in the aforementioned locations.  The largest lesion measures 1cm x 2.4cm x 0.2cm. Red, moist wound beds, some are indurated and with scarring in previously healed locations Wound bed:As described above Drainage (amount, consistency, odor) serous to serosanguinous in areas of increased friction Periwound:intact Dressing procedure/placement/frequency: I will provide orders for topical care using our house antimicrobial textile (InterDry Ag+) while in house, but this would not be curative. Recommend referral to either Surgery, Plastic Surgery or Dermatology for ongoing care and oversight of the lesions.  Occasionally, excision of the lesions has proven beneficial to other persons. A center for excellence for this condition [facility] [place]. Other level of care than conservative dressing suggestions exceeds the scope of [facility] practice.  [place] nursing team will not follow, but will remain available to this patient, the nursing and medical teams.  Please re-consult if needed. Thanks, Maudie Flakes, MSN, RN, [place], Ar[facility] Abbott  Pager# [phone]

## 2018-05-07 NOTE — Plan of Care (Signed)
  Problem: Education: Goal: Knowledge of General Education information will improve Description Including pain rating scale, medication(s)/side effects and non-pharmacologic comfort measures Outcome: Progressing   Problem: Health Behavior/Discharge Planning: Goal: Ability to manage health-related needs will improve Outcome: Progressing   

## 2018-05-07 NOTE — Anesthesia Preprocedure Evaluation (Addendum)
Anesthesia Evaluation  Patient identified by MRN, date of birth, ID band Patient awake    Reviewed: Allergy & Precautions, H&P , NPO status , Patient's Chart, lab work & pertinent test results  Airway Mallampati: II   Neck ROM: full    Dental   Pulmonary neg pulmonary ROS,    breath sounds clear to auscultation       Cardiovascular hypertension,  Rhythm:regular Rate:Normal     Neuro/Psych    GI/Hepatic   Endo/Other    Renal/GU ESRF and DialysisRenal disease     Musculoskeletal   Abdominal   Peds  Hematology   Anesthesia Other Findings   Reproductive/Obstetrics                            Anesthesia Physical Anesthesia Plan  ASA: III  Anesthesia Plan: General   Post-op Pain Management:    Induction: Intravenous  PONV Risk Score and Plan: 2 and Ondansetron, Midazolam, Dexamethasone and Treatment may vary due to age or medical condition  Airway Management Planned: LMA  Additional Equipment:   Intra-op Plan:   Post-operative Plan:   Informed Consent: I have reviewed the patients History and Physical, chart, labs and discussed the procedure including the risks, benefits and alternatives for the proposed anesthesia with the patient or authorized representative who has indicated his/her understanding and acceptance.       Plan Discussed with: CRNA, Anesthesiologist and Surgeon  Anesthesia Plan Comments:         Anesthesia Quick Evaluation

## 2018-05-07 NOTE — Progress Notes (Signed)
Pt has multiple open areas under bilateral axilla, bilateral breast tissue, and bilateral groin. Moisture is present to all areas with an odor. Pt sts the areas are always there, and he has never had any treatment for them. Pt denies fever at this time. Will relay to the surgeon.

## 2018-05-07 NOTE — Transfer of Care (Signed)
Immediate Anesthesia Transfer of Care Note  Patient: John Petty  Procedure(s) Performed: INSERTION OF ARTERIOVENOUS (AV) GORE-TEX GRAFT LEFT THIGH (Left Thigh)  Patient Location: PACU  Anesthesia Type:General  Level of Consciousness: awake, alert , oriented and sedated  Airway & Oxygen Therapy: Patient Spontanous Breathing and Patient connected to nasal cannula oxygen  Post-op Assessment: Report given to RN, Post -op Vital signs reviewed and stable and Patient moving all extremities  Post vital signs: Reviewed and stable  Last Vitals:  Vitals Value Taken Time  BP 142/94 05/07/2018 10:37 AM  Temp    Pulse 99 05/07/2018 10:40 AM  Resp 8 05/07/2018 10:40 AM  SpO2 98 % 05/07/2018 10:40 AM  Vitals shown include unvalidated device data.  Last Pain:  Vitals:   05/07/18 0705  TempSrc:   PainSc: 0-No pain         Complications: No apparent anesthesia complications

## 2018-05-07 NOTE — Discharge Instructions (Signed)
° °  [facility] ° °Discharge Instructions ° °AV Fistula or Graft Surgery for Dialysis Access ° °Please refer to the following instructions for your post-procedure care. Your surgeon or physician assistant will discuss any changes with you. ° °Activity ° °You may drive the day following your surgery, if you are comfortable and no longer taking prescription pain medication. Resume full activity as the soreness in your incision resolves. ° °Bathing/Showering ° °You may shower after you go home. Keep your incision dry for 48 hours. Do not soak in a bathtub, hot tub, or swim until the incision heals completely. You may not shower if you have a hemodialysis catheter. ° °Incision Care ° °Clean your incision with mild soap and water after 48 hours. Pat the area dry with a clean towel. You do not need a bandage unless otherwise instructed. Do not apply any ointments or creams to your incision. You may have skin glue on your incision. Do not peel it off. It will come off on its own in about one week. Your arm may swell a bit after surgery. To reduce swelling use pillows to elevate your arm so it is above your heart. Your doctor will tell you if you need to lightly wrap your arm with an ACE bandage. ° °Diet ° °Resume your normal diet. There are not special food restrictions following this procedure. In order to heal from your surgery, it is CRITICAL to get adequate nutrition. Your body requires vitamins, minerals, and protein. Vegetables are the best source of vitamins and minerals. Vegetables also provide the perfect balance of protein. Processed food has little nutritional value, so try to avoid this. ° °Medications ° °Resume taking all of your medications. If your incision is causing pain, you may take over-the counter pain relievers such as acetaminophen (Tylenol). If you were prescribed a stronger pain medication, please be aware these medications can cause nausea and constipation. Prevent  nausea by taking the medication with a snack or meal. Avoid constipation by drinking plenty of fluids and eating foods with high amount of fiber, such as fruits, vegetables, and grains. Do not take Tylenol if you are taking prescription pain medications. ° ° ° ° °Follow up °Your surgeon may want to see you in the office following your access surgery. If so, this will be arranged at the time of your surgery. ° °Please call us immediately for any of the following conditions: ° °Increased pain, redness, drainage (pus) from your incision site °Fever of 101 degrees or higher °Severe or worsening pain at your incision site °Hand pain or numbness. ° °Reduce your risk of vascular disease: ° °Stop smoking. If you would like help, call [facility] at 1-800-QUIT-NOW (1-800-784-8669) or [facility] at 336-586-4000 ° °Manage your cholesterol °Maintain a desired weight °Control your diabetes °Keep your blood pressure down ° °Dialysis ° °It will take several weeks to several months for your new dialysis access to be ready for use. Your surgeon will determine when it is OK to use it. Your nephrologist will continue to direct your dialysis. You can continue to use your Permcath until your new access is ready for use. ° °If you have any questions, please call the office at [phone]. ° °

## 2018-05-07 NOTE — Interval H&P Note (Signed)
History and Physical Interval Note:  05/07/2018 7:33 AM  John Petty  has presented today for surgery, with the diagnosis of END STAGE RENAL DISEASE.  The various methods of treatment have been discussed with the patient and family. After consideration of risks, benefits and other options for treatment, the patient has consented to  Procedure(s): INSERTION OF ARTERIOVENOUS (AV) GORE-TEX GRAFT LEFT THIGH (Right) as a surgical intervention.  The patient's history has been reviewed, patient examined, no change in status, stable for surgery.  I have reviewed the patient's chart and labs.  Questions were answered to the patient's satisfaction.     Annamarie Major

## 2018-05-07 NOTE — Anesthesia Procedure Notes (Signed)
Procedure Name: LMA Insertion Date/Time: 05/07/2018 8:44 AM Performed by: Scheryl Darter, CRNA Pre-anesthesia Checklist: Patient identified, Emergency Drugs available, Suction available and Patient being monitored Patient Re-evaluated:Patient Re-evaluated prior to induction Oxygen Delivery Method: Circle System Utilized Preoxygenation: Pre-oxygenation with 100% oxygen Induction Type: IV induction Ventilation: Mask ventilation without difficulty LMA: LMA inserted LMA Size: 5.0 Number of attempts: 1 Placement Confirmation: positive ETCO2 Tube secured with: Tape Dental Injury: Teeth and Oropharynx as per pre-operative assessment

## 2018-05-07 NOTE — Progress Notes (Signed)
Dr. Trula Slade shown the multiple open areas to the pt's skin, and sts that he will proceed with the surgery.

## 2018-05-07 NOTE — Op Note (Signed)
° ° °  Patient name: John Petty MRN: [id number] DOB: June 05, 1980 Sex: male  05/07/2018 Pre-operative Diagnosis: ESRD Post-operative diagnosis:  Same Surgeon:  Annamarie Major Assistants:  Arlee Muslim Procedure:   Left thigh dialysis graft. Anesthesia: General Blood Loss: 100 cc Specimens: None  Findings: Multiple lymph nodes in the groin.  The arterial anastomosis was to the superficial femoral artery which was disease-free.  The venous anastomosis was at the saphenofemoral junction.  I ligated the saphenous vein.  Indications: The patient is here today for new access.  Most of his access has been performed in [facility].  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  Therefore, we have decided to proceed with left thigh loop graft.  Procedure:  The patient was identified in the holding area and taken to [place] [address] [address]  The patient was then placed supine on the table. general anesthesia was administered.  The patient was prepped and draped in the usual sterile fashion.  A time out was called and antibiotics were administered.  Ultrasound was used to evaluate the femoral vessels to determine the appropriate location the incision.  A longitudinal incision was made in the left groin.  Cautery was used divide subtenons tissue down the femoral sheath which was opened sharply.  I then dissected out the saphenous vein.  There were multiple bulbous areas that made me think the saphenous vein was not the best venous target but rather the saphenofemoral junction.  I then dissected out the femoral vein which was a healthy 15 mm vein.  Next, the common femoral superficial femoral artery were dissected free.  Because the superficial femoral artery was disease-free I elected to use this.  Next a subcutaneous tunnel was create, using a counterincision in the groin.  A 6 mm Gore-Tex graft was brought through the tunnel.  The  patient was fully heparinized.  After the heparin circulated, the left superficial femoral artery was occluded with vascular clamps.  A #11 blade was used to make an arteriotomy which extended longitudinally Potts scissors.  The graft was beveled to fit the size of the arteriotomy and a running anastomosis created 6-0 Prolene.  Once this was completed there was excellent pulsatile flow through the graft.  At this point, I divided the saphenous vein.  A Cooley clamp was placed over the saphenofemoral junction.  I opened up the saphenofemoral junction and removed the valve at this area.  A end-to-side anastomosis was created between the graft and the vein at the saphenofemoral junction with running 6-0 Prolene.  Prior to completion the appropriate flushing maneuvers were performed and the anastomosis was completed.  Next, 50 mg of protamine was administered to reverse the heparin.  Once I was satisfied hemostasis, the counterincision in the groin was closed with 304 0 Vicryl.  The groin incision was closed with multiple layers of 2-0 and 3-0 Vicryl followed by Dermabond.  There were no immediate complications.   Disposition:  To PACU stable   V. Annamarie Major, M.D. [facility] Office: [phone] Pager:  [phone]

## 2018-05-08 ENCOUNTER — Encounter (HOSPITAL_COMMUNITY): Payer: Self-pay | Admitting: Surgery

## 2018-05-08 ENCOUNTER — Telehealth: Payer: Self-pay | Admitting: Surgery

## 2018-05-08 DIAGNOSIS — I120 Hypertensive chronic kidney disease with stage 5 chronic kidney disease or end stage renal disease: Secondary | ICD-10-CM | POA: Diagnosis not present

## 2018-05-08 LAB — BASIC METABOLIC PANEL
Anion gap: 14 (ref 5–15)
BUN: 45 mg/dL — ABNORMAL HIGH (ref 6–20)
CHLORIDE: 97 mmol/L — AB (ref 98–111)
CO2: 25 mmol/L (ref 22–32)
Calcium: 8.9 mg/dL (ref 8.9–10.3)
Creatinine, Ser: 10.35 mg/dL — ABNORMAL HIGH (ref 0.61–1.24)
GFR calc Af Amer: 7 mL/min — ABNORMAL LOW (ref >60)
GFR calc non Af Amer: 6 mL/min — ABNORMAL LOW (ref >60)
Glucose, Bld: 124 mg/dL — ABNORMAL HIGH (ref 70–99)
Potassium: 5.1 mmol/L (ref 3.5–5.1)
Sodium: 136 mmol/L (ref 135–145)

## 2018-05-08 LAB — CBC
HCT: 39 % (ref 39.0–52.0)
HEMOGLOBIN: 11 g/dL — AB (ref 13.0–17.0)
MCH: 25.1 pg — ABNORMAL LOW (ref 26.0–34.0)
MCHC: 28.2 g/dL — ABNORMAL LOW (ref 30.0–36.0)
MCV: 88.8 fL (ref 80.0–100.0)
Platelets: 264 10*3/uL (ref 150–400)
RBC: 4.39 MIL/uL (ref 4.22–5.81)
RDW: 18.1 % — ABNORMAL HIGH (ref 11.5–15.5)
WBC: 9 10*3/uL (ref 4.0–10.5)
nRBC: 0 % (ref 0.0–0.2)

## 2018-05-08 MED ORDER — OXYCODONE-ACETAMINOPHEN 5-325 MG PO TABS: 1.0000 | ORAL_TABLET | Freq: Four times a day (QID) | ORAL | 0 refills | Status: DC | PRN

## 2018-05-08 MED ORDER — HEPARIN SODIUM (PORCINE) 1000 UNIT/ML IJ SOLN
2600.0000 [IU] | Freq: Once | INTRAMUSCULAR | Status: AC
  Administered 2018-05-08: 2600 [IU] via INTRAVENOUS

## 2018-05-08 NOTE — Discharge Summary (Signed)
Physician Discharge Summary   Patient ID: John Petty [id number] 38 y.o. 06-Sep-1980  Admit date: 05/07/2018  Discharge date and time: 05/08/18   Admitting Physician: Serafina Mitchell, MD   Discharge Physician: same  Admission Diagnoses: END STAGE RENAL DISEASE  Discharge Diagnoses: same  Admission Condition: fair  Discharged Condition: fair  Indication for Admission: need for permanent dialysis access; observation overnight post-op  Hospital Course: [name] is a 38 year old male who was brought in as an outpatient for placement of left thigh dialysis graft by Dr. Trula Slade on 05/07/2018.  He was kept overnight in extended recovery/observation.  POD #1 left thigh graft was patent any still had a palpable left DP pulse.  Patient states he was unable to get a ride to his outpatient dialysis center today.  I spoke with a nephrology PA who called his [facility] and arranged for patient to dialyze tomorrow 05/09/2018 off schedule.  He will be prescribed 1 to 2 days of narcotic pain medication for continued postoperative pain control.  He will follow-up in office in about 2 weeks for incision check and graft patency.  Discharge instructions were reviewed the patient.  Dialysis will be able to access left thigh graft in 4 weeks.  He will be discharged home this afternoon in stable condition.  Consults: None  Treatments: surgery: Placement of left thigh arteriovenous dialysis graft by Dr. Trula Slade on 05/07/2018.  Discharge Exam: see progress note 05/08/18 Vitals:   05/08/18 1014 05/08/18 1242  BP: (!) 155/105 126/68  Pulse:    Resp:  (!) 23  Temp: 98.2 F (36.8 C) 97.6 F (36.4 C)  SpO2: 100%     Disposition: Discharge disposition: 01-Home or Self Care       Patient Instructions:  Allergies as of 05/08/2018   No Known Allergies     Medication List    TAKE these medications   amLODipine 5 MG tablet Commonly known as:  NORVASC Take 5 mg by mouth daily.  Usually take in the evening   oxyCODONE-acetaminophen 5-325 MG tablet Commonly known as:  PERCOCET/ROXICET Take 1 tablet by mouth every 6 (six) hours as needed for moderate pain.   sevelamer carbonate 800 MG tablet Commonly known as:  RENVELA Take 800 mg by mouth 3 (three) times daily with meals. 4 with meals and 3 with snacks      Activity: activity as tolerated Diet: regular diet Wound Care: keep wound clean and dry  Follow-up with Dr. Trula Slade in 2 weeks.  SignedDagoberto Ligas 05/08/2018 2:10 PM

## 2018-05-08 NOTE — Telephone Encounter (Signed)
Called and spoke with patient advised him of his appt on 05/26/18 @ 8:30 with Dr. Trula Slade.  Will mail follow up paperwork and reminder letter.

## 2018-05-08 NOTE — Anesthesia Postprocedure Evaluation (Signed)
Anesthesia Post Note  Patient: John Petty  Procedure(s) Performed: INSERTION OF ARTERIOVENOUS (AV) GORE-TEX GRAFT LEFT THIGH (Left Thigh)     Patient location during evaluation: PACU Anesthesia Type: General Level of consciousness: awake and alert Pain management: pain level controlled Vital Signs Assessment: post-procedure vital signs reviewed and stable Respiratory status: spontaneous breathing, nonlabored ventilation, respiratory function stable and patient connected to nasal cannula oxygen Cardiovascular status: blood pressure returned to baseline and stable Postop Assessment: no apparent nausea or vomiting Anesthetic complications: no    Last Vitals:  Vitals:   05/08/18 0515 05/08/18 1014  BP: (!) 155/97 (!) 155/105  Pulse: 72   Resp: 20   Temp: 36.5 C 36.8 C  SpO2: 100% 100%    Last Pain:  Vitals:   05/08/18 1014  TempSrc: Oral  PainSc:                  Alianza S

## 2018-05-08 NOTE — Progress Notes (Addendum)
  Progress Note    05/08/2018 10:18 AM 1 Day Post-Op  Subjective:  Anticipating discharge today.   Vitals:   05/08/18 0515 05/08/18 1014  BP: (!) 155/97 (!) 155/105  Pulse: 72   Resp: 20   Temp: 97.7 F (36.5 C) 98.2 F (36.8 C)  SpO2: 100% 100%   Physical Exam: Lungs:  Non labored Incisions:  L groin incisions c/d/i Extremities:  Palpable graft thrill; palpable L DP Abdomen:  Soft Neurologic: A&O  CBC    Component Value Date/Time   WBC 9.0 05/08/2018 0423   RBC 4.39 05/08/2018 0423   HGB 11.0 (L) 05/08/2018 0423   HCT 39.0 05/08/2018 0423   PLT 264 05/08/2018 0423   MCV 88.8 05/08/2018 0423   MCH 25.1 (L) 05/08/2018 0423   MCHC 28.2 (L) 05/08/2018 0423   RDW 18.1 (H) 05/08/2018 0423    BMET    Component Value Date/Time   NA 136 05/08/2018 0423   K 5.1 05/08/2018 0423   CL 97 (L) 05/08/2018 0423   CO2 25 05/08/2018 0423   GLUCOSE 124 (H) 05/08/2018 0423   BUN 45 (H) 05/08/2018 0423   CREATININE 10.35 (H) 05/08/2018 0423   CALCIUM 8.9 05/08/2018 0423   GFRNONAA 6 (L) 05/08/2018 0423   GFRAA 7 (L) 05/08/2018 0423    INR No results found for: INR   Intake/Output Summary (Last 24 hours) at 05/08/2018 1018 Last data filed at 05/07/2018 1040 Gross per 24 hour  Intake 100 ml  Output -  Net 100 ml     Assessment/Plan:  38 y.o. male is s/p L thigh AVG 1 Day Post-Op   Patent thigh graft Palpable L DP Ok for discharge Nephrology arranged dialysis tomorrow at outpatient center first shift   Dagoberto Ligas, PA-C [facility] [phone] 05/08/2018 10:18 AM  Agree with the above.  Anticipate d/c later today  WElls BRabham

## 2018-05-08 NOTE — Progress Notes (Signed)
IV team capped off HD cath for discharge. Telemetry discontinued. Discharge instructions reviewed with patient. All questions answered.   Emelda Fear, RN

## 2018-05-13 ENCOUNTER — Telehealth: Payer: Self-pay | Admitting: *Deleted

## 2018-05-13 ENCOUNTER — Other Ambulatory Visit: Payer: Self-pay | Admitting: *Deleted

## 2018-05-13 NOTE — Progress Notes (Signed)
Spoke with patient and instructed to be at [facility] admitting at 7 am on 05/15/2018 for surgery. No food or drink past MN night prior. Expect a phone call and follow the detailed instructions received from the hospital pre-admission department. They will give instructions regarding medications to take am of surgery. Must have a driver and caregiver for discharge. Verbalized understanding. Reminded him that [employer] will call him regarding re-scheduling of HD days.

## 2018-05-13 NOTE — Telephone Encounter (Signed)
Return call to [place] at [facility]. Will schedule patient for Thrmobectomy of A/V graft for 05/08/2018 7:30 am with Dr. Trula Slade per his instructions. They will arrange HD to accommodate his Thursday surgery.

## 2018-05-14 ENCOUNTER — Encounter (HOSPITAL_COMMUNITY): Payer: Self-pay | Admitting: *Deleted

## 2018-05-14 ENCOUNTER — Other Ambulatory Visit: Payer: Self-pay

## 2018-05-14 NOTE — Progress Notes (Signed)
Patient verbalized understanding of all pre-op instructions.

## 2018-05-14 NOTE — Anesthesia Preprocedure Evaluation (Signed)
Anesthesia Evaluation  Patient identified by MRN, date of birth, ID band Patient awake    Reviewed: Allergy & Precautions, H&P , NPO status , Patient's Chart, lab work & pertinent test results  History of Anesthesia Complications (+) history of anesthetic complications  Airway Mallampati: II   Neck ROM: full    Dental   Pulmonary neg pulmonary ROS,    breath sounds clear to auscultation       Cardiovascular hypertension,  Rhythm:regular Rate:Normal     Neuro/Psych    GI/Hepatic   Endo/Other    Renal/GU ESRF and DialysisRenal disease     Musculoskeletal   Abdominal   Peds  Hematology   Anesthesia Other Findings   Reproductive/Obstetrics                             Anesthesia Physical  Anesthesia Plan  ASA: III  Anesthesia Plan: General   Post-op Pain Management:    Induction: Intravenous  PONV Risk Score and Plan: 2 and Ondansetron, Midazolam, Dexamethasone and Treatment may vary due to age or medical condition  Airway Management Planned: LMA  Additional Equipment: None  Intra-op Plan:   Post-operative Plan: Extubation in OR  Informed Consent: I have reviewed the patients History and Physical, chart, labs and discussed the procedure including the risks, benefits and alternatives for the proposed anesthesia with the patient or authorized representative who has indicated his/her understanding and acceptance.     Dental advisory given  Plan Discussed with: CRNA  Anesthesia Plan Comments:         Anesthesia Quick Evaluation

## 2018-05-14 NOTE — Progress Notes (Signed)
Spoke with patient regarding pre-op instructions.  Patient denies chest pain or SOB.  Hx HTN - no other cardiac history. PCP -  Cardiologist -denies  Chest x-ray denies EKG -denies  Stress Test - denies ECHO - denies Cardiac Cath - denies  Sleep Study - N/A CPAP -   Patient instructed to stop taking now until after surgery - no vitamins, herbal supplements, Ibuprofen/NSAIDs, BC, Goody powder. Patient instructed not to take renal medication on DOS.     Coronavirus Screening  Have you or your Brother Erlene Quan experienced the following symptoms:  Cough yes/no: No Fever (>100.50F)  yes/no: No Congestion yes/no: No Sore throat yes/no: No  Nausea/Vomiting:  No Difficulty breathing/shortness of breath  yes/no: No  Have you or your brother Erlene Quan traveled in the last 14 days and where? yes/no: No

## 2018-05-15 ENCOUNTER — Encounter (HOSPITAL_COMMUNITY): Payer: Self-pay | Admitting: *Deleted

## 2018-05-15 ENCOUNTER — Ambulatory Visit (HOSPITAL_COMMUNITY)
Admission: RE | Admit: 2018-05-15 | Discharge: 2018-05-15 | Disposition: A | Discharge status: Home / Self Care | Payer: Medicare Other | Attending: Surgery | Admitting: Surgery

## 2018-05-15 ENCOUNTER — Other Ambulatory Visit: Payer: Self-pay

## 2018-05-15 ENCOUNTER — Ambulatory Visit (HOSPITAL_COMMUNITY): Payer: Medicare Other | Admitting: Certified Registered Nurse Anesthetist

## 2018-05-15 ENCOUNTER — Encounter (HOSPITAL_COMMUNITY)
Admission: RE | Disposition: A | Discharge status: Home / Self Care | Payer: Self-pay | Source: Home / Self Care | Attending: Surgery

## 2018-05-15 DIAGNOSIS — T82868A Thrombosis of vascular prosthetic devices, implants and grafts, initial encounter: Secondary | ICD-10-CM | POA: Insufficient documentation

## 2018-05-15 DIAGNOSIS — Z79899 Other long term (current) drug therapy: Secondary | ICD-10-CM | POA: Insufficient documentation

## 2018-05-15 DIAGNOSIS — I120 Hypertensive chronic kidney disease with stage 5 chronic kidney disease or end stage renal disease: Secondary | ICD-10-CM | POA: Diagnosis not present

## 2018-05-15 DIAGNOSIS — T82898A Other specified complication of vascular prosthetic devices, implants and grafts, initial encounter: Secondary | ICD-10-CM | POA: Diagnosis present

## 2018-05-15 DIAGNOSIS — Y832 Surgical operation with anastomosis, bypass or graft as the cause of abnormal reaction of the patient, or of later complication, without mention of misadventure at the time of the procedure: Secondary | ICD-10-CM | POA: Insufficient documentation

## 2018-05-15 DIAGNOSIS — N186 End stage renal disease: Secondary | ICD-10-CM | POA: Insufficient documentation

## 2018-05-15 HISTORY — PX: THROMBECTOMY AND REVISION OF ARTERIOVENTOUS (AV) GORETEX  GRAFT: SHX6120

## 2018-05-15 LAB — POCT I-STAT 4, (NA,K, GLUC, HGB,HCT)
Glucose, Bld: 91 mg/dL (ref 70–99)
HCT: 37 % — ABNORMAL LOW (ref 39.0–52.0)
Hemoglobin: 12.6 g/dL — ABNORMAL LOW (ref 13.0–17.0)
Potassium: 5.5 mmol/L — ABNORMAL HIGH (ref 3.5–5.1)
Sodium: 134 mmol/L — ABNORMAL LOW (ref 135–145)

## 2018-05-15 SURGERY — THROMBECTOMY AND REVISION OF ARTERIOVENTOUS (AV) GORETEX  GRAFT
Anesthesia: General | Laterality: Left

## 2018-05-15 MED ORDER — PROPOFOL 10 MG/ML IV BOLUS
INTRAVENOUS | Status: DC | PRN
  Administered 2018-05-15: 200 mg via INTRAVENOUS

## 2018-05-15 MED ORDER — OXYCODONE-ACETAMINOPHEN 5-325 MG PO TABS: 1.0000 | ORAL_TABLET | Freq: Four times a day (QID) | ORAL | 0 refills | Status: AC | PRN

## 2018-05-15 MED ORDER — LIDOCAINE 2% (20 MG/ML) 5 ML SYRINGE
INTRAMUSCULAR | Status: DC | PRN
  Administered 2018-05-15: 100 mg via INTRAVENOUS

## 2018-05-15 MED ORDER — SODIUM CHLORIDE 0.9 % IV SOLN
INTRAVENOUS | Status: DC | PRN
  Administered 2018-05-15: 50 ug/min via INTRAVENOUS

## 2018-05-15 MED ORDER — PHENYLEPHRINE 40 MCG/ML (10ML) SYRINGE FOR IV PUSH (FOR BLOOD PRESSURE SUPPORT)
PREFILLED_SYRINGE | INTRAVENOUS | Status: DC | PRN
  Administered 2018-05-15 (×5): 80 ug via INTRAVENOUS

## 2018-05-15 MED ORDER — SODIUM CHLORIDE 0.9 % IV SOLN
INTRAVENOUS | Status: AC
  Filled 2018-05-15: qty 1.2

## 2018-05-15 MED ORDER — FENTANYL CITRATE (PF) 100 MCG/2ML IJ SOLN
25.0000 ug | INTRAMUSCULAR | Status: DC | PRN
  Administered 2018-05-15: 25 ug via INTRAVENOUS

## 2018-05-15 MED ORDER — CHLORHEXIDINE GLUCONATE 4 % EX LIQD: 60.0000 mL | Freq: Once | CUTANEOUS | Status: DC

## 2018-05-15 MED ORDER — PHENYLEPHRINE 40 MCG/ML (10ML) SYRINGE FOR IV PUSH (FOR BLOOD PRESSURE SUPPORT)
PREFILLED_SYRINGE | INTRAVENOUS | Status: AC
  Filled 2018-05-15: qty 10

## 2018-05-15 MED ORDER — PROPOFOL 10 MG/ML IV BOLUS
INTRAVENOUS | Status: AC
  Filled 2018-05-15: qty 20

## 2018-05-15 MED ORDER — MIDAZOLAM HCL 2 MG/2ML IJ SOLN
INTRAMUSCULAR | Status: AC
  Filled 2018-05-15: qty 2

## 2018-05-15 MED ORDER — PROMETHAZINE HCL 25 MG/ML IJ SOLN: 6.2500 mg | INTRAMUSCULAR | Status: DC | PRN

## 2018-05-15 MED ORDER — ONDANSETRON HCL 4 MG/2ML IJ SOLN
INTRAMUSCULAR | Status: AC
  Filled 2018-05-15: qty 2

## 2018-05-15 MED ORDER — MIDAZOLAM HCL 5 MG/5ML IJ SOLN
INTRAMUSCULAR | Status: DC | PRN
  Administered 2018-05-15: 2 mg via INTRAVENOUS

## 2018-05-15 MED ORDER — DEXAMETHASONE SODIUM PHOSPHATE 10 MG/ML IJ SOLN
INTRAMUSCULAR | Status: AC
  Filled 2018-05-15: qty 1

## 2018-05-15 MED ORDER — CEFAZOLIN SODIUM-DEXTROSE 2-4 GM/100ML-% IV SOLN
2.0000 g | INTRAVENOUS | Status: AC
  Administered 2018-05-15: 2 g via INTRAVENOUS
  Filled 2018-05-15: qty 100

## 2018-05-15 MED ORDER — FENTANYL CITRATE (PF) 250 MCG/5ML IJ SOLN
INTRAMUSCULAR | Status: AC
  Filled 2018-05-15: qty 5

## 2018-05-15 MED ORDER — MEPERIDINE HCL 50 MG/ML IJ SOLN: 6.2500 mg | INTRAMUSCULAR | Status: DC | PRN

## 2018-05-15 MED ORDER — HEPARIN SODIUM (PORCINE) 1000 UNIT/ML IJ SOLN
INTRAMUSCULAR | Status: DC | PRN
  Administered 2018-05-15: 5000 [IU] via INTRAVENOUS

## 2018-05-15 MED ORDER — SODIUM CHLORIDE 0.9 % IV SOLN
INTRAVENOUS | Status: DC | PRN
  Administered 2018-05-15: 09:00:00

## 2018-05-15 MED ORDER — SODIUM CHLORIDE 0.9 % IV SOLN
INTRAVENOUS | Status: DC
  Administered 2018-05-15: 08:00:00 via INTRAVENOUS

## 2018-05-15 MED ORDER — FENTANYL CITRATE (PF) 100 MCG/2ML IJ SOLN
INTRAMUSCULAR | Status: AC
  Filled 2018-05-15: qty 2

## 2018-05-15 MED ORDER — HEPARIN SODIUM (PORCINE) 1000 UNIT/ML IJ SOLN
INTRAMUSCULAR | Status: AC
  Filled 2018-05-15: qty 1

## 2018-05-15 MED ORDER — ONDANSETRON HCL 4 MG/2ML IJ SOLN
INTRAMUSCULAR | Status: DC | PRN
  Administered 2018-05-15: 4 mg via INTRAVENOUS

## 2018-05-15 MED ORDER — 0.9 % SODIUM CHLORIDE (POUR BTL) OPTIME
TOPICAL | Status: DC | PRN
  Administered 2018-05-15: 1000 mL

## 2018-05-15 MED ORDER — DEXAMETHASONE SODIUM PHOSPHATE 10 MG/ML IJ SOLN
INTRAMUSCULAR | Status: DC | PRN
  Administered 2018-05-15: 5 mg via INTRAVENOUS

## 2018-05-15 MED ORDER — LIDOCAINE 2% (20 MG/ML) 5 ML SYRINGE
INTRAMUSCULAR | Status: AC
  Filled 2018-05-15: qty 5

## 2018-05-15 MED ORDER — FENTANYL CITRATE (PF) 100 MCG/2ML IJ SOLN
INTRAMUSCULAR | Status: DC | PRN
  Administered 2018-05-15 (×6): 25 ug via INTRAVENOUS

## 2018-05-15 MED ORDER — HEMOSTATIC AGENTS (NO CHARGE) OPTIME
TOPICAL | Status: DC | PRN
  Administered 2018-05-15: 1 via TOPICAL

## 2018-05-15 SURGICAL SUPPLY — 39 items
ARMBAND PINK RESTRICT EXTREMIT (MISCELLANEOUS) ×6 IMPLANT
CANISTER SUCT 3000ML PPV (MISCELLANEOUS) ×3 IMPLANT
CATH EMB 4FR 80CM (CATHETERS) ×6 IMPLANT
CLIP VESOCCLUDE MED 6/CT (CLIP) ×3 IMPLANT
CLIP VESOCCLUDE SM WIDE 6/CT (CLIP) ×3 IMPLANT
COVER WAND RF STERILE (DRAPES) ×3 IMPLANT
DERMABOND ADVANCED (GAUZE/BANDAGES/DRESSINGS) ×2
DERMABOND ADVANCED .7 DNX12 (GAUZE/BANDAGES/DRESSINGS) ×1 IMPLANT
DRESSING PREVENA PLUS CUSTOM (GAUZE/BANDAGES/DRESSINGS) ×1 IMPLANT
DRSG PREVENA PLUS CUSTOM (GAUZE/BANDAGES/DRESSINGS) ×3
ELECT REM PT RETURN 9FT ADLT (ELECTROSURGICAL) ×3
ELECTRODE REM PT RTRN 9FT ADLT (ELECTROSURGICAL) ×1 IMPLANT
GLOVE BIO SURGEON STRL SZ7 (GLOVE) ×3 IMPLANT
GLOVE BIOGEL PI IND STRL 6.5 (GLOVE) ×2 IMPLANT
GLOVE BIOGEL PI IND STRL 7.5 (GLOVE) ×2 IMPLANT
GLOVE BIOGEL PI INDICATOR 6.5 (GLOVE) ×4
GLOVE BIOGEL PI INDICATOR 7.5 (GLOVE) ×4
GLOVE SURG SS PI 6.5 STRL IVOR (GLOVE) ×3 IMPLANT
GLOVE SURG SS PI 7.5 STRL IVOR (GLOVE) ×3 IMPLANT
GOWN STRL REUS W/ TWL LRG LVL3 (GOWN DISPOSABLE) ×2 IMPLANT
GOWN STRL REUS W/ TWL XL LVL3 (GOWN DISPOSABLE) ×1 IMPLANT
GOWN STRL REUS W/TWL LRG LVL3 (GOWN DISPOSABLE) ×4
GOWN STRL REUS W/TWL XL LVL3 (GOWN DISPOSABLE) ×2
HEMOSTAT SNOW SURGICEL 2X4 (HEMOSTASIS) ×3 IMPLANT
KIT BASIN OR (CUSTOM PROCEDURE TRAY) ×3 IMPLANT
KIT DRSG PREVENA PLUS 7DAY 125 (MISCELLANEOUS) ×3 IMPLANT
KIT TURNOVER KIT B (KITS) ×3 IMPLANT
NS IRRIG 1000ML POUR BTL (IV SOLUTION) ×3 IMPLANT
PACK CV ACCESS (CUSTOM PROCEDURE TRAY) ×3 IMPLANT
PAD ARMBOARD 7.5X6 YLW CONV (MISCELLANEOUS) ×6 IMPLANT
SPONGE LAP 18X18 X RAY DECT (DISPOSABLE) ×3 IMPLANT
SUT PROLENE 6 0 BV (SUTURE) ×6 IMPLANT
SUT VIC AB 3-0 SH 27 (SUTURE) ×2
SUT VIC AB 3-0 SH 27X BRD (SUTURE) ×1 IMPLANT
SUT VICRYL 4-0 PS2 18IN ABS (SUTURE) IMPLANT
SYRINGE 3CC LL L/F (MISCELLANEOUS) ×3 IMPLANT
TOWEL GREEN STERILE (TOWEL DISPOSABLE) ×3 IMPLANT
UNDERPAD 30X30 (UNDERPADS AND DIAPERS) ×3 IMPLANT
WATER STERILE IRR 1000ML POUR (IV SOLUTION) ×3 IMPLANT

## 2018-05-15 NOTE — Transfer of Care (Signed)
Immediate Anesthesia Transfer of Care Note  Patient: John Petty  Procedure(s) Performed: THROMBECTOMY OF ARTERIOVENTOUS (AV) GORETEX  GRAFT LEFT THIGH (Left )  Patient Location: PACU  Anesthesia Type:General  Level of Consciousness: awake, alert  and oriented  Airway & Oxygen Therapy: Patient Spontanous Breathing and Patient connected to face mask oxygen  Post-op Assessment: Report given to RN and Post -op Vital signs reviewed and stable  Post vital signs: Reviewed and stable  Last Vitals:  Vitals Value Taken Time  BP 122/82 05/15/2018 11:32 AM  Temp    Pulse 77 05/15/2018 11:34 AM  Resp 18 05/15/2018 11:34 AM  SpO2 100 % 05/15/2018 11:34 AM  Vitals shown include unvalidated device data.  Last Pain:  Vitals:   05/15/18 0731  TempSrc:   PainSc: 3       Patients Stated Pain Goal: 3 (57/89/78 4784)  Complications: No apparent anesthesia complications

## 2018-05-15 NOTE — H&P (Signed)
   Patient name: John Petty MRN: [id number] DOB: [birth year] Sex: male    HISTORY OF PRESENT ILLNESS:   John Petty is a 38 y.o. male s/p left thigh AVGG a few days ago that has now clotted  CURRENT MEDICATIONS:    Current Facility-Administered Medications  Medication Dose Route Frequency Provider Last Rate Last Dose  . 0.9 %  sodium chloride infusion   Intravenous Continuous Serafina Mitchell, MD 10 mL/hr at 05/15/18 0735    . 0.9 % irrigation (POUR BTL)    PRN Serafina Mitchell, MD   1,000 mL at 05/15/18 0911  . ceFAZolin (ANCEF) IVPB 2g/100 mL premix  2 g Intravenous 30 min Pre-Op Serafina Mitchell, MD      . chlorhexidine (HIBICLENS) 4 % liquid 4 application  60 mL Topical Once Serafina Mitchell, MD       And  . Derrill Memo ON 05/16/2018] chlorhexidine (HIBICLENS) 4 % liquid 4 application  60 mL Topical Once Serafina Mitchell, MD      . heparin 6,000 Units in sodium chloride 0.9 % 500 mL irrigation    PRN Serafina Mitchell, MD        REVIEW OF SYSTEMS:   [X]  denotes positive finding, [ ]  denotes negative finding Cardiac  Comments:  Chest pain or chest pressure:    Shortness of breath upon exertion:    Short of breath when lying flat:    Irregular heart rhythm:    Constitutional    Fever or chills:      PHYSICAL EXAM:   Vitals:   05/14/18 1024 05/15/18 0715 05/15/18 0731  BP:  136/83   Pulse:  87   Resp:  18   Temp:  98.7 F (37.1 C)   TempSrc:  Oral   SpO2:  100%   Weight: 52.4 kg  52.4 kg  Height: 5\' 5"  (1.651 m)  5\' 5"  (1.651 m)    GENERAL: The patient is a well-nourished male, in no acute distress. The vital signs are documented above. CARDIOVASCULAR: There is a regular rate and rhythm. PULMONARY: Non-labored respirations   STUDIES:      MEDICAL ISSUES:   Will attempt to thrombectomize graft  Leia Alf, MD, FACS [facility] [phone] Pager [phone]

## 2018-05-15 NOTE — Anesthesia Procedure Notes (Signed)
Procedure Name: LMA Insertion Date/Time: 05/15/2018 10:09 AM Performed by: Genelle Bal, CRNA Pre-anesthesia Checklist: Patient identified, Emergency Drugs available, Suction available and Patient being monitored Patient Re-evaluated:Patient Re-evaluated prior to induction Oxygen Delivery Method: Circle system utilized Preoxygenation: Pre-oxygenation with 100% oxygen Induction Type: IV induction Ventilation: Mask ventilation without difficulty LMA: LMA inserted LMA Size: 5.0 Number of attempts: 1 Airway Equipment and Method: Bite block Placement Confirmation: positive ETCO2 Tube secured with: Tape Dental Injury: Teeth and Oropharynx as per pre-operative assessment

## 2018-05-15 NOTE — Anesthesia Postprocedure Evaluation (Signed)
Anesthesia Post Note  Patient: [name]  Procedure(s) Performed: THROMBECTOMY OF ARTERIOVENTOUS (AV) GORETEX  GRAFT LEFT THIGH (Left )     Patient location during evaluation: PACU Anesthesia Type: General Level of consciousness: sedated and patient cooperative Pain management: pain level controlled Vital Signs Assessment: post-procedure vital signs reviewed and stable Respiratory status: spontaneous breathing Cardiovascular status: stable Anesthetic complications: no    Last Vitals:  Vitals:   05/15/18 1245 05/15/18 1318  BP: 105/69 104/73  Pulse: 72 76  Resp: 14 14  Temp:  (!) 36.4 C  SpO2: 100% 100%    Last Pain:  Vitals:   05/15/18 1318  TempSrc:   PainSc: 3     LLE Motor Response: Purposeful movement;Responds to commands (05/15/18 1318) LLE Sensation: Full sensation (05/15/18 1318)          Nolon Nations

## 2018-05-15 NOTE — Op Note (Signed)
° ° °  Patient name: John Petty MRN: [id number] DOB: 1980/12/28 Sex: male  05/15/2018 Pre-operative Diagnosis: Occluded left thigh dialysis graft Post-operative diagnosis:  Same Surgeon:  Annamarie Major Assistants: Laurence Slate Procedure:   #1: Simple thrombectomy, left thigh dialysis graft   #2: Placement of Praveena wound VAC Anesthesia: General Blood Loss: 50 cc Specimens: None  Findings: Acute occlusion of left thigh graft.  Thrombectomy was performed with a #4 Fogarty catheter.  No resistance was found within the graft or at the arterial and venous anastomosis.  The graft was felt to be somewhat redundant and so several centimeters were resected from the apex of the graft and a primary end-to-end anastomosis was performed  Indications: Several days ago the patient underwent placement of the left thigh dialysis graft.  This has occluded.  He comes in today for thrombectomy.  Procedure:  The patient was identified in the holding area and taken to [place] [address]  The patient was then placed supine on the table. general anesthesia was administered.  The patient was prepped and draped in the usual sterile fashion.  A time out was called and antibiotics were administered.  The incision at the apex of the graft in the left thigh was opened by removing the sutures and Dermabond.  The graft was easily exposed.  I placed some tension on the graft and felt that it was somewhat redundant.  I then fully heparinized the patient.  The graft was then transected.  I then passed a #4 Fogarty catheter across the venous anastomosis.  This went without difficulty.  Thrombectomy was performed removing all the clot and establishing good backbleeding.  Several negative passes were performed.  The venous limb of the graft was flushed with heparin saline and reoccluded.  I then passed the #4 Fogarty catheter across the arterial anastomosis.  The arterial plug was removed as well as acute thrombus.  Excellent inflow was  established.  I then passed the catheter multiple times and no further clot was evacuated.  I then resected several centimeters of the graft and performed a primary end to end anastomosis.  After this was completed I did not feel there was a good pulse within the graft, and so I elected to open it again.  The graft was occluded and a #11 blade was used to make a graftotomy and a separate site from the previous anastomosis.  There was excellent inflow and good backbleeding.  I passed a #4 Fogarty catheter again across the venous and arterial anastomoses and no clot was extracted.  Again there was excellent bleeding from the arterial limb and good backbleeding from the venous limb.  I then closed the graftotomy with 6-0 Prolene.  I did not reverse the heparin.  The incision was closed with a 3-0 Vicryl and Dermabond.  There was a palpable thrill within the graft at this point.  I elected to place a Praveena wound VAC over the previous left groin incision because this area had significant moisture when I saw him in the recovery room and I am worried about wound healing of the left groin incision.  He was then successfully extubated and taken to recovery in a stable condition   Disposition: To PACU stable   V. Annamarie Major, M.D., [facility] [place] Office: [phone] Pager:  [phone]

## 2018-05-16 ENCOUNTER — Encounter (HOSPITAL_COMMUNITY): Payer: Self-pay | Admitting: Surgery

## 2018-05-16 ENCOUNTER — Telehealth: Payer: Self-pay

## 2018-05-16 ENCOUNTER — Telehealth: Payer: Self-pay | Admitting: *Deleted

## 2018-05-16 NOTE — Telephone Encounter (Signed)
Pt called with c/o wound vac alarm going off that air is leaking. He tried adjusting it with no success. Katy Apo (follow up) is going to look into his insurance and try to set up home health. She will call him to inform him of the status on home health.

## 2018-05-16 NOTE — Telephone Encounter (Signed)
Patient called c/o wound vac "leaking" but is unable to find where. The patient was not scheduled with home health upon discharge. I called [facility] home health and spoke to [facility].She is going to push for the nurse to go out today, however, the latest would be tomorrow, 05/17/2018.  Patient is aware of the information.

## 2018-05-20 ENCOUNTER — Telehealth: Payer: Self-pay | Admitting: Family

## 2018-05-20 NOTE — Telephone Encounter (Signed)
FYI.. The Home health called to inform us that she tried calling the [facility] where the pt  dialyzes so that they can check for a bruit in his thigh graft when he goes there  tomorrow 05/21/2018.  There was no answer.  Thurston Hole., LPN

## 2018-05-23 ENCOUNTER — Telehealth (HOSPITAL_COMMUNITY): Payer: Self-pay | Admitting: Rehabilitation

## 2018-05-23 NOTE — Telephone Encounter (Signed)
The above patient or their representative was contacted and gave the following answers to these questions:         Do you have any of the following symptoms? No  Fever                    Cough                   Shortness of breath  Do  you have any of the following other symptoms? No   muscle pain         vomiting,        diarrhea        rash         weakness        red eye        abdominal pain         bruising          bruising or bleeding              joint pain           severe headache    Have you been in contact with someone who was or has been sick in the past 2 weeks? No  Yes                 Unsure                         Unable to assess   Does the person that you were in contact with have any of the following symptoms?   Cough         shortness of breath           muscle pain         vomiting,            diarrhea            rash            weakness           fever            red eye           abdominal pain           bruising  or  bleeding                joint pain                severe headache               Have you  or someone you have been in contact with traveled internationally in th last month? No        If yes, which countries?   Have you  or someone you have been in contact with traveled outside [place] in th last month? No         If yes, which state and city?   COMMENTS OR ACTION PLAN FOR THIS PATIENT:          

## 2018-05-26 ENCOUNTER — Other Ambulatory Visit: Payer: Self-pay

## 2018-05-26 ENCOUNTER — Encounter: Payer: Self-pay | Admitting: Family

## 2018-05-26 ENCOUNTER — Encounter: Payer: Medicare Other | Admitting: Surgery

## 2018-05-26 ENCOUNTER — Other Ambulatory Visit: Payer: Self-pay | Admitting: *Deleted

## 2018-05-26 ENCOUNTER — Encounter: Payer: Self-pay | Admitting: *Deleted

## 2018-05-26 ENCOUNTER — Ambulatory Visit (INDEPENDENT_AMBULATORY_CARE_PROVIDER_SITE_OTHER): Payer: Self-pay | Admitting: Family

## 2018-05-26 VITALS — BP 128/76 | HR 90 | Temp 98.2°F | Resp 18 | Ht 65.0 in | Wt 269.0 lb

## 2018-05-26 DIAGNOSIS — N186 End stage renal disease: Secondary | ICD-10-CM

## 2018-05-26 DIAGNOSIS — T82868D Thrombosis of vascular prosthetic devices, implants and grafts, subsequent encounter: Secondary | ICD-10-CM

## 2018-05-26 DIAGNOSIS — Z992 Dependence on renal dialysis: Secondary | ICD-10-CM

## 2018-05-26 NOTE — Progress Notes (Signed)
CC: Left thigh AVG with no thrill, no bruit   History of Present Illness  John Petty is a 38 y.o. (06-Feb-1981) male who is s/p simple thrombectomy of left thigh dialysis graft and placement of Praveena wound VAC on 05-15-18 by Dr. Trula Slade for occluded left thigh AV graft.  Left thigh AV graft was placed on 05-07-18 by Dr. Trula Slade.   Pt returns today with concerns that left thigh AV graft may be occluded.  Pt denies fever or chills. Pt indicates that the wound vac could never properly seal, and the [facility] nurse had to remove the wound vac, and perform daily dry dressing changes, HH visits him twice/week, pt changes dressings 5 days/week. Pt states he is not taking any antibiotics.   He has had HD accesses in both arms.  Pt denies any steal type symptoms in his upper or lower extremities.  He currently dialyzes via right thigh catheter on T-TH-S at at [facility]   He is not able to shower due to the presence of his right thigh hemodialysis catheter. He indicates that he spot bathes.   Pt indicates that the cysts on his neck, abdomen, and perineum have been recurrent and present for a long time.    Past Medical History:  Diagnosis Date  . Chronic kidney disease    esrd  . Complication of anesthesia    IV- site burned and itching- one time event  . History of blood transfusion   . Hypertension     Social History Social History   Tobacco Use  . Smoking status: Never Smoker  . Smokeless tobacco: Never Used  Substance Use Topics  . Alcohol use: Yes    Comment: maybe 1 mixed drink 2 times week  . Drug use: Not Currently    Family History Family History  Problem Relation Age of Onset  . Heart disease Mother   . CVA Mother   . Heart disease Father   . CVA Father   . Kidney disease Father     Surgical History Past Surgical History:  Procedure Laterality Date  . ARTERIOVENOUS GRAFT PLACEMENT    . AV FISTULA PLACEMENT    . AV FISTULA PLACEMENT Left 05/07/2018    Procedure: INSERTION OF ARTERIOVENOUS (AV) GORE-TEX GRAFT LEFT THIGH;  Surgeon: Serafina Mitchell, MD;  Location: [facility];  Service: Vascular;  Laterality: Left;  . AVF    . INSERTION OF DIALYSIS CATHETER    . THROMBECTOMY AND REVISION OF ARTERIOVENTOUS (AV) GORETEX  GRAFT Left 05/15/2018   Procedure: THROMBECTOMY OF ARTERIOVENTOUS (AV) GORETEX  GRAFT LEFT THIGH;  Surgeon: Serafina Mitchell, MD;  Location: [facility];  Service: Vascular;  Laterality: Left;  . TONSILLECTOMY      No Known Allergies  Current Outpatient Medications  Medication Sig Dispense Refill  . amLODipine (NORVASC) 5 MG tablet Take 5 mg by mouth daily. Usually take in the evening    . sevelamer carbonate (RENVELA) 800 MG tablet Take 800 mg by mouth 3 (three) times daily with meals. 4 with meals and 3 with snacks    . oxyCODONE-acetaminophen (PERCOCET/ROXICET) 5-325 MG tablet Take 1 tablet by mouth every 6 (six) hours as needed. (Patient not taking: Reported on 05/26/2018) 6 tablet 0   No current facility-administered medications for this visit.      REVIEW OF SYSTEMS: see HPI for pertinent positives and negatives    PHYSICAL EXAMINATION:  Vitals:   05/26/18 1147  BP: 128/76  Pulse: 90  Resp: 18  Temp: 98.2 F (36.8 C)  TempSrc: Oral  SpO2: 100%  Weight: 269 lb (122 kg)  Height: 5\' 5"  (1.651 m)   Body mass index is 44.76 kg/m.  General: The patient appears his stated age, morbidly obese male.    HEENT:  No gross abnormalities, large neck Pulmonary: Respirations are non-labored, CTAB, good air movement in all fields.  Abdomen: Soft and non-tender with normal bowel sounds. Large panus. Musculoskeletal: There are no major deformities.   Neurologic: No focal weakness or paresthesias are detected, M/S 5/5 in all extremities. Skin: There are no ulcer or rashes noted. There are multiple cystic structures at the base of his neck, abdomen, and his perineum. Left thigh AV graft incision is 2 cm deep with no tracking,  appears clean with no erythema, no drainage, no foul odor from incision.  GU: Retracted penis. Foul odor of perineum. Psychiatric: The patient has normal affect. Cardiovascular: There is a regular rate and rhythm without significant murmur appreciated.  Right femoral pulse is 2+ palpable, left femoral pulse is 1+ palpable.  Bilateral DP pulses are 1+ palpable.  Bilateral radial pulses are 1+ palpable.  Right thigh HD catheter in place.  Left thigh AV graft: unable to palpate a thrill or a pulse, unable to hear a bruit at the AV graft with stethoscope.    Medical Decision Making  John Petty is a 38 y.o. male who is s/p simple thrombectomy of left thigh dialysis graft and placement of Praveena wound VAC on 05-15-18 by Dr. Trula Slade for occluded left thigh AV graft.  Left thigh AV graft was placed on 05-07-18 by Dr. Trula Slade.   I spoke with Dr. Trula Slade who is familiar with pt, no bruit heard and no thrill palpated at left thigh AV graft. Will schedule bilateral upper extremity central venogram to evaluate for HERO. Recurrence of thrombosis at left thigh AVG.    Clemon Chambers, RN, MSN, FNP-C [facility] [place] Office: [phone]  05/26/2018, 12:01 PM  Clinic MD: Trula Slade

## 2018-05-27 ENCOUNTER — Telehealth: Payer: Self-pay | Admitting: *Deleted

## 2018-05-27 NOTE — Telephone Encounter (Signed)
Patient having difficulty getting transportation for 05/28/2018 procedure. Called to cancel. Patient has a thigh TDC for dialysis so will defer scheduling until procedures are lifted. He is to call this office if any problems with TDC/dialysis.

## 2018-05-28 ENCOUNTER — Encounter (HOSPITAL_COMMUNITY): Admission: RE | Payer: Self-pay | Source: Home / Self Care

## 2018-05-28 ENCOUNTER — Ambulatory Visit (HOSPITAL_COMMUNITY): Admission: RE | Admit: 2018-05-28 | Payer: Medicare Other | Source: Home / Self Care | Admitting: Surgery

## 2018-05-28 SURGERY — UPPER EXTREMITY ANGIOGRAPHY
Anesthesia: LOCAL

## 2018-06-10 ENCOUNTER — Telehealth: Payer: Self-pay | Admitting: *Deleted

## 2018-06-10 NOTE — Telephone Encounter (Signed)
Spoke with Research scientist (physical sciences) at [employer]. She is working with patient and Medicaid to arrange transportation for patient. She will call this office back when transportation available to get patient in for venogram.

## 2018-06-10 NOTE — Telephone Encounter (Signed)
Patient is not willing to schedule any procedure at this time due to [place]. Discuss procedure and plans for future graft surgery and time line. Informed [facility] to call when patient is ready to schedule or any problems with TDC.

## 2018-09-19 ENCOUNTER — Telehealth: Payer: Self-pay | Admitting: *Deleted

## 2018-09-19 NOTE — Telephone Encounter (Signed)
Called and spoke with Neoma Laming at [facility]. This office has not been contacted by patient or [facility] to re-schedule Venogram in planning for permanent HD access. She will see that the nurses are aware and speak with patient to arrange. Understand that [facility] or patient to contact this office to schedule.

## 2018-12-23 ENCOUNTER — Encounter (HOSPITAL_COMMUNITY): Payer: Self-pay | Admitting: Emergency Medicine

## 2018-12-23 ENCOUNTER — Emergency Department (HOSPITAL_COMMUNITY)
Admission: EM | Admit: 2018-12-23 | Discharge: 2018-12-24 | Discharge status: Against Medical Advice | Payer: Medicare Other | Attending: Emergency Medicine | Admitting: Emergency Medicine

## 2018-12-23 ENCOUNTER — Other Ambulatory Visit: Payer: Self-pay

## 2018-12-23 ENCOUNTER — Other Ambulatory Visit (HOSPITAL_COMMUNITY): Payer: Self-pay | Admitting: Nephrology

## 2018-12-23 ENCOUNTER — Other Ambulatory Visit (HOSPITAL_COMMUNITY): Payer: Self-pay | Admitting: Nurse Practitioner

## 2018-12-23 DIAGNOSIS — Z5321 Procedure and treatment not carried out due to patient leaving prior to being seen by health care provider: Secondary | ICD-10-CM | POA: Insufficient documentation

## 2018-12-23 DIAGNOSIS — Y712 Prosthetic and other implants, materials and accessory cardiovascular devices associated with adverse incidents: Secondary | ICD-10-CM | POA: Insufficient documentation

## 2018-12-23 DIAGNOSIS — N186 End stage renal disease: Secondary | ICD-10-CM

## 2018-12-23 DIAGNOSIS — Z992 Dependence on renal dialysis: Secondary | ICD-10-CM

## 2018-12-23 DIAGNOSIS — T85611A Breakdown (mechanical) of intraperitoneal dialysis catheter, initial encounter: Secondary | ICD-10-CM | POA: Diagnosis present

## 2018-12-23 NOTE — ED Triage Notes (Signed)
Pt reports going to dialysis this date and his catheter was coming out of the insertion site in his groin. Dialysis would not touch him.

## 2018-12-23 NOTE — ED Notes (Signed)
Patient states he is leaving. States if he knew it would be this crowded he would have waited until Thursday for a catheter

## 2018-12-24 ENCOUNTER — Other Ambulatory Visit: Payer: Self-pay | Admitting: Radiology

## 2018-12-25 ENCOUNTER — Encounter (HOSPITAL_COMMUNITY): Payer: Self-pay | Admitting: Interventional Radiology

## 2018-12-25 ENCOUNTER — Other Ambulatory Visit: Payer: Self-pay

## 2018-12-25 ENCOUNTER — Ambulatory Visit (HOSPITAL_COMMUNITY)
Admission: RE | Admit: 2018-12-25 | Discharge: 2018-12-25 | Disposition: A | Discharge status: Home / Self Care | Payer: Medicare Other | Source: Ambulatory Visit | Attending: Nephrology | Admitting: Nephrology

## 2018-12-25 DIAGNOSIS — Z992 Dependence on renal dialysis: Secondary | ICD-10-CM | POA: Insufficient documentation

## 2018-12-25 DIAGNOSIS — Z452 Encounter for adjustment and management of vascular access device: Secondary | ICD-10-CM | POA: Diagnosis present

## 2018-12-25 DIAGNOSIS — N186 End stage renal disease: Secondary | ICD-10-CM | POA: Insufficient documentation

## 2018-12-25 HISTORY — PX: IR FLUORO GUIDE CV LINE LEFT: IMG2282

## 2018-12-25 MED ORDER — HEPARIN SODIUM (PORCINE) 1000 UNIT/ML IJ SOLN
INTRAMUSCULAR | Status: AC
  Administered 2018-12-25: 14:00:00 5.2 mL
  Filled 2018-12-25: qty 1

## 2018-12-25 MED ORDER — SODIUM CHLORIDE 0.9 % IV SOLN: INTRAVENOUS | Status: DC

## 2018-12-25 MED ORDER — HEPARIN SODIUM (PORCINE) 1000 UNIT/ML IJ SOLN
INTRAMUSCULAR | Status: AC
  Filled 2018-12-25: qty 1

## 2018-12-25 MED ORDER — CHLORHEXIDINE GLUCONATE 4 % EX LIQD
CUTANEOUS | Status: AC
  Filled 2018-12-25: qty 15

## 2018-12-25 MED ORDER — CEFAZOLIN SODIUM-DEXTROSE 2-4 GM/100ML-% IV SOLN
2.0000 g | INTRAVENOUS | Status: AC
  Administered 2018-12-25: 13:00:00 2 g via INTRAVENOUS

## 2018-12-25 MED ORDER — LIDOCAINE HCL (PF) 1 % IJ SOLN
INTRAMUSCULAR | Status: AC | PRN
  Administered 2018-12-25: 10 mL

## 2018-12-25 MED ORDER — LIDOCAINE HCL 1 % IJ SOLN
INTRAMUSCULAR | Status: AC
  Filled 2018-12-25: qty 20

## 2018-12-25 MED ORDER — CEFAZOLIN SODIUM-DEXTROSE 2-4 GM/100ML-% IV SOLN
INTRAVENOUS | Status: AC
  Administered 2018-12-25: 2 g via INTRAVENOUS
  Filled 2018-12-25: qty 100

## 2018-12-25 NOTE — Progress Notes (Signed)
Patient ID: John Petty, male   DOB: 1980/12/10, 38 y.o.   MRN: [id number]   Request for  HD cath exchange Catheter "coming out"  Placed in Jan 2020 at [facility] No other issues  Plan now for catheter exchange

## 2018-12-26 ENCOUNTER — Ambulatory Visit (HOSPITAL_COMMUNITY): Payer: Medicare Other

## 2019-05-28 ENCOUNTER — Emergency Department (HOSPITAL_COMMUNITY): Payer: Medicare Other

## 2019-05-28 ENCOUNTER — Other Ambulatory Visit: Payer: Self-pay

## 2019-05-28 ENCOUNTER — Inpatient Hospital Stay (HOSPITAL_COMMUNITY)
Admission: EM | Admit: 2019-05-28 | Discharge: 2019-06-27 | DRG: 673 | Disposition: E | Payer: Medicare Other | Attending: Internal Medicine | Admitting: Internal Medicine

## 2019-05-28 DIAGNOSIS — E1122 Type 2 diabetes mellitus with diabetic chronic kidney disease: Secondary | ICD-10-CM | POA: Diagnosis present

## 2019-05-28 DIAGNOSIS — Z20822 Contact with and (suspected) exposure to covid-19: Secondary | ICD-10-CM | POA: Diagnosis present

## 2019-05-28 DIAGNOSIS — Z4659 Encounter for fitting and adjustment of other gastrointestinal appliance and device: Secondary | ICD-10-CM

## 2019-05-28 DIAGNOSIS — Z978 Presence of other specified devices: Secondary | ICD-10-CM

## 2019-05-28 DIAGNOSIS — T829XXA Unspecified complication of cardiac and vascular prosthetic device, implant and graft, initial encounter: Secondary | ICD-10-CM

## 2019-05-28 DIAGNOSIS — N186 End stage renal disease: Secondary | ICD-10-CM

## 2019-05-28 DIAGNOSIS — R000 Tachycardia, unspecified: Secondary | ICD-10-CM | POA: Diagnosis present

## 2019-05-28 DIAGNOSIS — R6521 Severe sepsis with septic shock: Secondary | ICD-10-CM | POA: Diagnosis present

## 2019-05-28 DIAGNOSIS — Y828 Other medical devices associated with adverse incidents: Secondary | ICD-10-CM | POA: Diagnosis present

## 2019-05-28 DIAGNOSIS — R601 Generalized edema: Secondary | ICD-10-CM | POA: Diagnosis present

## 2019-05-28 DIAGNOSIS — I9589 Other hypotension: Secondary | ICD-10-CM | POA: Diagnosis present

## 2019-05-28 DIAGNOSIS — D62 Acute posthemorrhagic anemia: Secondary | ICD-10-CM | POA: Diagnosis present

## 2019-05-28 DIAGNOSIS — I120 Hypertensive chronic kidney disease with stage 5 chronic kidney disease or end stage renal disease: Secondary | ICD-10-CM | POA: Diagnosis present

## 2019-05-28 DIAGNOSIS — I469 Cardiac arrest, cause unspecified: Secondary | ICD-10-CM | POA: Diagnosis not present

## 2019-05-28 DIAGNOSIS — T82898A Other specified complication of vascular prosthetic devices, implants and grafts, initial encounter: Secondary | ICD-10-CM | POA: Diagnosis not present

## 2019-05-28 DIAGNOSIS — Z841 Family history of disorders of kidney and ureter: Secondary | ICD-10-CM

## 2019-05-28 DIAGNOSIS — E875 Hyperkalemia: Secondary | ICD-10-CM | POA: Diagnosis not present

## 2019-05-28 DIAGNOSIS — Z992 Dependence on renal dialysis: Secondary | ICD-10-CM

## 2019-05-28 DIAGNOSIS — E872 Acidosis: Secondary | ICD-10-CM | POA: Diagnosis not present

## 2019-05-28 DIAGNOSIS — G931 Anoxic brain damage, not elsewhere classified: Secondary | ICD-10-CM | POA: Diagnosis not present

## 2019-05-28 DIAGNOSIS — Z79899 Other long term (current) drug therapy: Secondary | ICD-10-CM

## 2019-05-28 DIAGNOSIS — J9601 Acute respiratory failure with hypoxia: Secondary | ICD-10-CM | POA: Diagnosis not present

## 2019-05-28 DIAGNOSIS — K922 Gastrointestinal hemorrhage, unspecified: Secondary | ICD-10-CM | POA: Diagnosis present

## 2019-05-28 DIAGNOSIS — X58XXXA Exposure to other specified factors, initial encounter: Secondary | ICD-10-CM | POA: Diagnosis present

## 2019-05-28 DIAGNOSIS — Z8249 Family history of ischemic heart disease and other diseases of the circulatory system: Secondary | ICD-10-CM

## 2019-05-28 DIAGNOSIS — Z66 Do not resuscitate: Secondary | ICD-10-CM | POA: Diagnosis not present

## 2019-05-28 DIAGNOSIS — E11649 Type 2 diabetes mellitus with hypoglycemia without coma: Secondary | ICD-10-CM | POA: Diagnosis present

## 2019-05-28 DIAGNOSIS — E8770 Fluid overload, unspecified: Secondary | ICD-10-CM | POA: Diagnosis present

## 2019-05-28 DIAGNOSIS — R58 Hemorrhage, not elsewhere classified: Secondary | ICD-10-CM | POA: Diagnosis present

## 2019-05-28 DIAGNOSIS — D631 Anemia in chronic kidney disease: Secondary | ICD-10-CM | POA: Diagnosis present

## 2019-05-28 DIAGNOSIS — T8242XA Displacement of vascular dialysis catheter, initial encounter: Secondary | ICD-10-CM | POA: Diagnosis not present

## 2019-05-28 DIAGNOSIS — N179 Acute kidney failure, unspecified: Secondary | ICD-10-CM | POA: Diagnosis not present

## 2019-05-28 DIAGNOSIS — Z6841 Body Mass Index (BMI) 40.0 and over, adult: Secondary | ICD-10-CM

## 2019-05-28 DIAGNOSIS — N2581 Secondary hyperparathyroidism of renal origin: Secondary | ICD-10-CM | POA: Diagnosis present

## 2019-05-28 DIAGNOSIS — E162 Hypoglycemia, unspecified: Secondary | ICD-10-CM

## 2019-05-28 DIAGNOSIS — A711 Active stage of trachoma: Secondary | ICD-10-CM | POA: Diagnosis present

## 2019-05-28 DIAGNOSIS — Z823 Family history of stroke: Secondary | ICD-10-CM

## 2019-05-28 DIAGNOSIS — T827XXA Infection and inflammatory reaction due to other cardiac and vascular devices, implants and grafts, initial encounter: Secondary | ICD-10-CM | POA: Diagnosis present

## 2019-05-28 DIAGNOSIS — R570 Cardiogenic shock: Secondary | ICD-10-CM | POA: Diagnosis not present

## 2019-05-28 DIAGNOSIS — Z95828 Presence of other vascular implants and grafts: Secondary | ICD-10-CM

## 2019-05-28 DIAGNOSIS — A419 Sepsis, unspecified organism: Secondary | ICD-10-CM

## 2019-05-28 DIAGNOSIS — Z515 Encounter for palliative care: Secondary | ICD-10-CM | POA: Diagnosis not present

## 2019-05-28 DIAGNOSIS — Z781 Physical restraint status: Secondary | ICD-10-CM

## 2019-05-28 DIAGNOSIS — R451 Restlessness and agitation: Secondary | ICD-10-CM | POA: Diagnosis not present

## 2019-05-28 DIAGNOSIS — G9341 Metabolic encephalopathy: Secondary | ICD-10-CM | POA: Diagnosis present

## 2019-05-28 DIAGNOSIS — Z789 Other specified health status: Secondary | ICD-10-CM

## 2019-05-28 LAB — CBG MONITORING, ED
Glucose-Capillary: 13 mg/dL — CL (ref 70–99)
Glucose-Capillary: 14 mg/dL — CL (ref 70–99)
Glucose-Capillary: 17 mg/dL — CL (ref 70–99)
Glucose-Capillary: 20 mg/dL — CL (ref 70–99)

## 2019-05-28 LAB — I-STAT CHEM 8, ED
BUN: 60 mg/dL — ABNORMAL HIGH (ref 6–20)
Calcium, Ion: 0.98 mmol/L — ABNORMAL LOW (ref 1.15–1.40)
Chloride: 102 mmol/L (ref 98–111)
Creatinine, Ser: 13.1 mg/dL — ABNORMAL HIGH (ref 0.61–1.24)
Glucose, Bld: 172 mg/dL — ABNORMAL HIGH (ref 70–99)
HCT: 28 % — ABNORMAL LOW (ref 39.0–52.0)
Hemoglobin: 9.5 g/dL — ABNORMAL LOW (ref 13.0–17.0)
Potassium: 5.9 mmol/L — ABNORMAL HIGH (ref 3.5–5.1)
Sodium: 133 mmol/L — ABNORMAL LOW (ref 135–145)
TCO2: 13 mmol/L — ABNORMAL LOW (ref 22–32)

## 2019-05-28 MED ORDER — DEXTROSE 50 % IV SOLN
INTRAVENOUS | Status: AC
  Filled 2019-05-28: qty 50

## 2019-05-28 MED ORDER — GLUCAGON HCL RDNA (DIAGNOSTIC) 1 MG IJ SOLR
INTRAMUSCULAR | Status: AC
  Administered 2019-05-29: 1 mg
  Filled 2019-05-28: qty 1

## 2019-05-28 MED ORDER — GLUCAGON HCL RDNA (DIAGNOSTIC) 1 MG IJ SOLR: 1.0000 mg | Freq: Once | INTRAMUSCULAR | Status: DC

## 2019-05-28 MED ORDER — SODIUM CHLORIDE 0.9% IV SOLUTION: Freq: Once | INTRAVENOUS | Status: AC

## 2019-05-28 NOTE — ED Triage Notes (Signed)
Patient was walking when his dialysis catheter in his right thigh got caught in a stair/hand rail and it was removed. Currently bleeding controlled (clotted off). Pt has dialysis on Tues, Thurs, Sat but missed dialysis today.

## 2019-05-28 NOTE — ED Notes (Signed)
First emergency PRBC finished.

## 2019-05-29 ENCOUNTER — Inpatient Hospital Stay (HOSPITAL_COMMUNITY): Payer: Medicare Other

## 2019-05-29 ENCOUNTER — Other Ambulatory Visit (HOSPITAL_COMMUNITY): Payer: Medicare Other

## 2019-05-29 ENCOUNTER — Emergency Department (HOSPITAL_COMMUNITY): Payer: Medicare Other

## 2019-05-29 DIAGNOSIS — Z6841 Body Mass Index (BMI) 40.0 and over, adult: Secondary | ICD-10-CM | POA: Diagnosis not present

## 2019-05-29 DIAGNOSIS — A419 Sepsis, unspecified organism: Secondary | ICD-10-CM

## 2019-05-29 DIAGNOSIS — I469 Cardiac arrest, cause unspecified: Secondary | ICD-10-CM

## 2019-05-29 DIAGNOSIS — G9341 Metabolic encephalopathy: Secondary | ICD-10-CM

## 2019-05-29 DIAGNOSIS — T8242XA Displacement of vascular dialysis catheter, initial encounter: Secondary | ICD-10-CM | POA: Diagnosis present

## 2019-05-29 DIAGNOSIS — Y828 Other medical devices associated with adverse incidents: Secondary | ICD-10-CM | POA: Diagnosis present

## 2019-05-29 DIAGNOSIS — Z823 Family history of stroke: Secondary | ICD-10-CM | POA: Diagnosis not present

## 2019-05-29 DIAGNOSIS — R58 Hemorrhage, not elsewhere classified: Secondary | ICD-10-CM | POA: Diagnosis present

## 2019-05-29 DIAGNOSIS — R9431 Abnormal electrocardiogram [ECG] [EKG]: Secondary | ICD-10-CM | POA: Diagnosis not present

## 2019-05-29 DIAGNOSIS — Z515 Encounter for palliative care: Secondary | ICD-10-CM | POA: Diagnosis not present

## 2019-05-29 DIAGNOSIS — E1122 Type 2 diabetes mellitus with diabetic chronic kidney disease: Secondary | ICD-10-CM | POA: Diagnosis present

## 2019-05-29 DIAGNOSIS — N179 Acute kidney failure, unspecified: Secondary | ICD-10-CM | POA: Diagnosis not present

## 2019-05-29 DIAGNOSIS — N186 End stage renal disease: Secondary | ICD-10-CM

## 2019-05-29 DIAGNOSIS — R6521 Severe sepsis with septic shock: Secondary | ICD-10-CM | POA: Diagnosis present

## 2019-05-29 DIAGNOSIS — E162 Hypoglycemia, unspecified: Secondary | ICD-10-CM | POA: Diagnosis not present

## 2019-05-29 DIAGNOSIS — J9601 Acute respiratory failure with hypoxia: Secondary | ICD-10-CM | POA: Diagnosis not present

## 2019-05-29 DIAGNOSIS — I120 Hypertensive chronic kidney disease with stage 5 chronic kidney disease or end stage renal disease: Secondary | ICD-10-CM | POA: Diagnosis present

## 2019-05-29 DIAGNOSIS — R000 Tachycardia, unspecified: Secondary | ICD-10-CM | POA: Diagnosis present

## 2019-05-29 DIAGNOSIS — E11649 Type 2 diabetes mellitus with hypoglycemia without coma: Secondary | ICD-10-CM | POA: Diagnosis present

## 2019-05-29 DIAGNOSIS — E875 Hyperkalemia: Secondary | ICD-10-CM

## 2019-05-29 DIAGNOSIS — D62 Acute posthemorrhagic anemia: Secondary | ICD-10-CM | POA: Diagnosis present

## 2019-05-29 DIAGNOSIS — Z992 Dependence on renal dialysis: Secondary | ICD-10-CM

## 2019-05-29 DIAGNOSIS — G931 Anoxic brain damage, not elsewhere classified: Secondary | ICD-10-CM | POA: Diagnosis not present

## 2019-05-29 DIAGNOSIS — N2581 Secondary hyperparathyroidism of renal origin: Secondary | ICD-10-CM | POA: Diagnosis present

## 2019-05-29 DIAGNOSIS — E872 Acidosis: Secondary | ICD-10-CM | POA: Diagnosis not present

## 2019-05-29 DIAGNOSIS — Z66 Do not resuscitate: Secondary | ICD-10-CM | POA: Diagnosis not present

## 2019-05-29 DIAGNOSIS — X58XXXA Exposure to other specified factors, initial encounter: Secondary | ICD-10-CM | POA: Diagnosis present

## 2019-05-29 DIAGNOSIS — Z8249 Family history of ischemic heart disease and other diseases of the circulatory system: Secondary | ICD-10-CM | POA: Diagnosis not present

## 2019-05-29 DIAGNOSIS — K922 Gastrointestinal hemorrhage, unspecified: Secondary | ICD-10-CM | POA: Diagnosis present

## 2019-05-29 DIAGNOSIS — Z20822 Contact with and (suspected) exposure to covid-19: Secondary | ICD-10-CM | POA: Diagnosis present

## 2019-05-29 HISTORY — PX: IR FLUORO GUIDE CV LINE RIGHT: IMG2283

## 2019-05-29 HISTORY — PX: IR US GUIDE VASC ACCESS RIGHT: IMG2390

## 2019-05-29 LAB — POCT I-STAT 7, (LYTES, BLD GAS, ICA,H+H)
Acid-base deficit: 8 mmol/L — ABNORMAL HIGH (ref 0.0–2.0)
Bicarbonate: 20 mmol/L (ref 20.0–28.0)
Calcium, Ion: 1.13 mmol/L — ABNORMAL LOW (ref 1.15–1.40)
HCT: 29 % — ABNORMAL LOW (ref 39.0–52.0)
Hemoglobin: 9.9 g/dL — ABNORMAL LOW (ref 13.0–17.0)
O2 Saturation: 100 %
Patient temperature: 98.7
Potassium: 6 mmol/L — ABNORMAL HIGH (ref 3.5–5.1)
Sodium: 134 mmol/L — ABNORMAL LOW (ref 135–145)
TCO2: 22 mmol/L (ref 22–32)
pCO2 arterial: 54.6 mmHg — ABNORMAL HIGH (ref 32.0–48.0)
pH, Arterial: 7.173 — CL (ref 7.350–7.450)
pO2, Arterial: 298 mmHg — ABNORMAL HIGH (ref 83.0–108.0)

## 2019-05-29 LAB — CBC
HCT: 31.5 % — ABNORMAL LOW (ref 39.0–52.0)
HCT: 29.7 % — ABNORMAL LOW (ref 39.0–52.0)
Hemoglobin: 9.7 g/dL — ABNORMAL LOW (ref 13.0–17.0)
Hemoglobin: 9.6 g/dL — ABNORMAL LOW (ref 13.0–17.0)
MCH: 28.6 pg (ref 26.0–34.0)
MCH: 29.4 pg (ref 26.0–34.0)
MCHC: 30.8 g/dL (ref 30.0–36.0)
MCHC: 32.3 g/dL (ref 30.0–36.0)
MCV: 92.9 fL (ref 80.0–100.0)
MCV: 90.8 fL (ref 80.0–100.0)
Platelets: 173 10*3/uL (ref 150–400)
RBC: 3.39 MIL/uL — ABNORMAL LOW (ref 4.22–5.81)
RBC: 3.27 MIL/uL — ABNORMAL LOW (ref 4.22–5.81)
RDW: 19.7 % — ABNORMAL HIGH (ref 11.5–15.5)
RDW: 19.8 % — ABNORMAL HIGH (ref 11.5–15.5)
WBC: 17.8 10*3/uL — ABNORMAL HIGH (ref 4.0–10.5)
WBC: 11.1 10*3/uL — ABNORMAL HIGH (ref 4.0–10.5)
nRBC: 0.2 % (ref 0.0–0.2)
nRBC: 0 % (ref 0.0–0.2)

## 2019-05-29 LAB — RENAL FUNCTION PANEL
Albumin: 2 g/dL — ABNORMAL LOW (ref 3.5–5.0)
Albumin: 1.8 g/dL — ABNORMAL LOW (ref 3.5–5.0)
Anion gap: 25 — ABNORMAL HIGH (ref 5–15)
Anion gap: 18 — ABNORMAL HIGH (ref 5–15)
BUN: 71 mg/dL — ABNORMAL HIGH (ref 6–20)
BUN: 76 mg/dL — ABNORMAL HIGH (ref 6–20)
CO2: 16 mmol/L — ABNORMAL LOW (ref 22–32)
CO2: 18 mmol/L — ABNORMAL LOW (ref 22–32)
Calcium: 8.8 mg/dL — ABNORMAL LOW (ref 8.9–10.3)
Calcium: 8.3 mg/dL — ABNORMAL LOW (ref 8.9–10.3)
Chloride: 97 mmol/L — ABNORMAL LOW (ref 98–111)
Chloride: 100 mmol/L (ref 98–111)
Creatinine, Ser: 12.86 mg/dL — ABNORMAL HIGH (ref 0.61–1.24)
Creatinine, Ser: 12.26 mg/dL — ABNORMAL HIGH (ref 0.61–1.24)
GFR calc Af Amer: 5 mL/min — ABNORMAL LOW (ref >60)
GFR calc Af Amer: 5 mL/min — ABNORMAL LOW (ref >60)
GFR calc non Af Amer: 4 mL/min — ABNORMAL LOW (ref >60)
GFR calc non Af Amer: 5 mL/min — ABNORMAL LOW (ref >60)
Glucose, Bld: 85 mg/dL (ref 70–99)
Glucose, Bld: 126 mg/dL — ABNORMAL HIGH (ref 70–99)
Phosphorus: 10.1 mg/dL — ABNORMAL HIGH (ref 2.5–4.6)
Phosphorus: 8.9 mg/dL — ABNORMAL HIGH (ref 2.5–4.6)
Potassium: 6.1 mmol/L — ABNORMAL HIGH (ref 3.5–5.1)
Potassium: 6.4 mmol/L (ref 3.5–5.1)
Sodium: 138 mmol/L (ref 135–145)
Sodium: 136 mmol/L (ref 135–145)

## 2019-05-29 LAB — BASIC METABOLIC PANEL
Anion gap: 21 — ABNORMAL HIGH (ref 5–15)
Anion gap: 24 — ABNORMAL HIGH (ref 5–15)
Anion gap: 27 — ABNORMAL HIGH (ref 5–15)
BUN: 70 mg/dL — ABNORMAL HIGH (ref 6–20)
BUN: 72 mg/dL — ABNORMAL HIGH (ref 6–20)
BUN: 72 mg/dL — ABNORMAL HIGH (ref 6–20)
CO2: 13 mmol/L — ABNORMAL LOW (ref 22–32)
CO2: 16 mmol/L — ABNORMAL LOW (ref 22–32)
CO2: 16 mmol/L — ABNORMAL LOW (ref 22–32)
Calcium: 8.4 mg/dL — ABNORMAL LOW (ref 8.9–10.3)
Calcium: 8.4 mg/dL — ABNORMAL LOW (ref 8.9–10.3)
Calcium: 8.7 mg/dL — ABNORMAL LOW (ref 8.9–10.3)
Chloride: 95 mmol/L — ABNORMAL LOW (ref 98–111)
Chloride: 99 mmol/L (ref 98–111)
Chloride: 99 mmol/L (ref 98–111)
Creatinine, Ser: 11.74 mg/dL — ABNORMAL HIGH (ref 0.61–1.24)
Creatinine, Ser: 12.67 mg/dL — ABNORMAL HIGH (ref 0.61–1.24)
Creatinine, Ser: 12.8 mg/dL — ABNORMAL HIGH (ref 0.61–1.24)
GFR calc Af Amer: 5 mL/min — ABNORMAL LOW (ref >60)
GFR calc Af Amer: 5 mL/min — ABNORMAL LOW (ref >60)
GFR calc Af Amer: 6 mL/min — ABNORMAL LOW (ref >60)
GFR calc non Af Amer: 4 mL/min — ABNORMAL LOW (ref >60)
GFR calc non Af Amer: 4 mL/min — ABNORMAL LOW (ref >60)
GFR calc non Af Amer: 5 mL/min — ABNORMAL LOW (ref >60)
Glucose, Bld: 148 mg/dL — ABNORMAL HIGH (ref 70–99)
Glucose, Bld: 184 mg/dL — ABNORMAL HIGH (ref 70–99)
Glucose, Bld: 90 mg/dL (ref 70–99)
Potassium: 5.9 mmol/L — ABNORMAL HIGH (ref 3.5–5.1)
Potassium: 6.2 mmol/L — ABNORMAL HIGH (ref 3.5–5.1)
Potassium: 6.9 mmol/L (ref 3.5–5.1)
Sodium: 135 mmol/L (ref 135–145)
Sodium: 136 mmol/L (ref 135–145)
Sodium: 139 mmol/L (ref 135–145)

## 2019-05-29 LAB — GLUCOSE, CAPILLARY
Glucose-Capillary: 64 mg/dL — ABNORMAL LOW (ref 70–99)
Glucose-Capillary: 80 mg/dL (ref 70–99)
Glucose-Capillary: 60 mg/dL — ABNORMAL LOW (ref 70–99)
Glucose-Capillary: 65 mg/dL — ABNORMAL LOW (ref 70–99)
Glucose-Capillary: 84 mg/dL (ref 70–99)
Glucose-Capillary: 39 mg/dL — CL (ref 70–99)
Glucose-Capillary: 69 mg/dL — ABNORMAL LOW (ref 70–99)
Glucose-Capillary: 72 mg/dL (ref 70–99)
Glucose-Capillary: 91 mg/dL (ref 70–99)
Glucose-Capillary: 30 mg/dL — CL (ref 70–99)
Glucose-Capillary: 118 mg/dL — ABNORMAL HIGH (ref 70–99)
Glucose-Capillary: 112 mg/dL — ABNORMAL HIGH (ref 70–99)
Glucose-Capillary: 141 mg/dL — ABNORMAL HIGH (ref 70–99)

## 2019-05-29 LAB — CBC WITH DIFFERENTIAL/PLATELET
Abs Immature Granulocytes: 1.71 10*3/uL — ABNORMAL HIGH (ref 0.00–0.07)
Basophils Absolute: 0.1 10*3/uL (ref 0.0–0.1)
Basophils Relative: 0 %
Eosinophils Absolute: 0 10*3/uL (ref 0.0–0.5)
Eosinophils Relative: 0 %
HCT: 26.6 % — ABNORMAL LOW (ref 39.0–52.0)
Hemoglobin: 8 g/dL — ABNORMAL LOW (ref 13.0–17.0)
Immature Granulocytes: 7 %
Lymphocytes Relative: 2 %
Lymphs Abs: 0.4 10*3/uL — ABNORMAL LOW (ref 0.7–4.0)
MCH: 29.3 pg (ref 26.0–34.0)
MCHC: 30.1 g/dL (ref 30.0–36.0)
MCV: 97.4 fL (ref 80.0–100.0)
Monocytes Absolute: 0.7 10*3/uL (ref 0.1–1.0)
Monocytes Relative: 3 %
Neutro Abs: 22.6 10*3/uL — ABNORMAL HIGH (ref 1.7–7.7)
Neutrophils Relative %: 88 %
Platelets: 200 10*3/uL (ref 150–400)
RBC: 2.73 MIL/uL — ABNORMAL LOW (ref 4.22–5.81)
RDW: 19.9 % — ABNORMAL HIGH (ref 11.5–15.5)
WBC: 25.2 10*3/uL — ABNORMAL HIGH (ref 4.0–10.5)
nRBC: 0 % (ref 0.0–0.2)

## 2019-05-29 LAB — MRSA PCR SCREENING: MRSA by PCR: NEGATIVE

## 2019-05-29 LAB — APTT: aPTT: 41 seconds — ABNORMAL HIGH (ref 24–36)

## 2019-05-29 LAB — RESPIRATORY PANEL BY RT PCR (FLU A&B, COVID)
Influenza A by PCR: NEGATIVE
Influenza B by PCR: NEGATIVE
SARS Coronavirus 2 by RT PCR: NEGATIVE

## 2019-05-29 LAB — ECHOCARDIOGRAM LIMITED
Height: 64 in
Weight: 4306.91 oz

## 2019-05-29 LAB — PHOSPHORUS: Phosphorus: 9.9 mg/dL — ABNORMAL HIGH (ref 2.5–4.6)

## 2019-05-29 LAB — CBG MONITORING, ED
Glucose-Capillary: 140 mg/dL — ABNORMAL HIGH (ref 70–99)
Glucose-Capillary: 58 mg/dL — ABNORMAL LOW (ref 70–99)

## 2019-05-29 LAB — HIV ANTIBODY (ROUTINE TESTING W REFLEX): HIV Screen 4th Generation wRfx: NONREACTIVE

## 2019-05-29 LAB — PROTIME-INR
INR: 1.8 — ABNORMAL HIGH (ref 0.8–1.2)
Prothrombin Time: 20.7 seconds — ABNORMAL HIGH (ref 11.4–15.2)

## 2019-05-29 LAB — MAGNESIUM
Magnesium: 1.9 mg/dL (ref 1.7–2.4)
Magnesium: 2 mg/dL (ref 1.7–2.4)

## 2019-05-29 LAB — ABO/RH: ABO/RH(D): B POS

## 2019-05-29 LAB — LACTIC ACID, PLASMA
Lactic Acid, Venous: 3.4 mmol/L (ref 0.5–1.9)
Lactic Acid, Venous: 2.9 mmol/L (ref 0.5–1.9)

## 2019-05-29 LAB — HEMOGLOBIN A1C
Hgb A1c MFr Bld: 6.2 % — ABNORMAL HIGH (ref 4.8–5.6)
Mean Plasma Glucose: 131.24 mg/dL

## 2019-05-29 LAB — BLOOD PRODUCT ORDER (VERBAL) VERIFICATION

## 2019-05-29 LAB — PLATELET COUNT: Platelets: UNDETERMINED 10*3/uL (ref 150–400)

## 2019-05-29 LAB — SARS CORONAVIRUS 2 (TAT 6-24 HRS): SARS Coronavirus 2: NEGATIVE

## 2019-05-29 LAB — FIBRINOGEN: Fibrinogen: 664 mg/dL — ABNORMAL HIGH (ref 210–475)

## 2019-05-29 MED ORDER — PRISMASOL BGK 4/2.5 32-4-2.5 MEQ/L REPLACEMENT SOLN
Status: DC
  Filled 2019-05-29 (×2): qty 5000

## 2019-05-29 MED ORDER — FENTANYL BOLUS VIA INFUSION
50.0000 ug | INTRAVENOUS | Status: DC | PRN
  Administered 2019-05-29: 20:00:00 50 ug via INTRAVENOUS
  Filled 2019-05-29: qty 100

## 2019-05-29 MED ORDER — DEXTROSE 50 % IV SOLN
INTRAVENOUS | Status: AC
  Filled 2019-05-29: qty 50

## 2019-05-29 MED ORDER — SODIUM BICARBONATE 8.4 % IV SOLN
INTRAVENOUS | Status: AC
  Filled 2019-05-29: qty 200

## 2019-05-29 MED ORDER — GELATIN ABSORBABLE 12-7 MM EX MISC
CUTANEOUS | Status: DC | PRN
  Administered 2019-05-29: 1 via TOPICAL

## 2019-05-29 MED ORDER — DEXTROSE 50 % IV SOLN
INTRAVENOUS | Status: AC
  Administered 2019-05-29: 50 mL
  Filled 2019-05-29: qty 50

## 2019-05-29 MED ORDER — MIDAZOLAM HCL 2 MG/2ML IJ SOLN: 1.0000 mg | INTRAMUSCULAR | Status: DC | PRN

## 2019-05-29 MED ORDER — ETOMIDATE 2 MG/ML IV SOLN
20.0000 mg | Freq: Once | INTRAVENOUS | Status: AC
  Administered 2019-05-29: 20 mg via INTRAVENOUS

## 2019-05-29 MED ORDER — PRISMASOL BGK 0/2.5 32-2.5 MEQ/L IV SOLN
INTRAVENOUS | Status: DC
  Filled 2019-05-29 (×2): qty 5000

## 2019-05-29 MED ORDER — ACETAMINOPHEN 650 MG RE SUPP: 650.0000 mg | Freq: Four times a day (QID) | RECTAL | Status: DC | PRN

## 2019-05-29 MED ORDER — SODIUM POLYSTYRENE SULFONATE 15 GM/60ML PO SUSP
15.0000 g | Freq: Once | ORAL | Status: DC
  Filled 2019-05-29: qty 60

## 2019-05-29 MED ORDER — INSULIN ASPART 100 UNIT/ML ~~LOC~~ SOLN: 0.0000 [IU] | SUBCUTANEOUS | Status: DC

## 2019-05-29 MED ORDER — FENTANYL 2500MCG IN NS 250ML (10MCG/ML) PREMIX INFUSION
0.0000 ug/h | INTRAVENOUS | Status: DC
  Administered 2019-05-29: 20:00:00 325 ug/h via INTRAVENOUS
  Administered 2019-05-29: 13:00:00 50 ug/h via INTRAVENOUS
  Filled 2019-05-29 (×2): qty 250

## 2019-05-29 MED ORDER — CHLORHEXIDINE GLUCONATE 4 % EX LIQD
CUTANEOUS | Status: AC
  Filled 2019-05-29: qty 15

## 2019-05-29 MED ORDER — VANCOMYCIN HCL 2000 MG/400ML IV SOLN
2000.0000 mg | Freq: Once | INTRAVENOUS | Status: AC
  Administered 2019-05-29: 2000 mg via INTRAVENOUS
  Filled 2019-05-29: qty 400

## 2019-05-29 MED ORDER — NOREPINEPHRINE 16 MG/250ML-% IV SOLN
0.0000 ug/min | INTRAVENOUS | Status: DC
  Administered 2019-05-29: 40 ug/min via INTRAVENOUS
  Administered 2019-05-30: 13:00:00 50 ug/min via INTRAVENOUS
  Administered 2019-05-30: 07:00:00 40 ug/min via INTRAVENOUS
  Filled 2019-05-29 (×3): qty 250

## 2019-05-29 MED ORDER — PRISMASOL BGK 0/2.5 32-2.5 MEQ/L IV SOLN
INTRAVENOUS | Status: DC
  Filled 2019-05-29 (×11): qty 5000

## 2019-05-29 MED ORDER — VITAL HIGH PROTEIN PO LIQD: 1000.0000 mL | ORAL | Status: DC

## 2019-05-29 MED ORDER — SEVELAMER CARBONATE 2.4 G PO PACK
2.4000 g | PACK | Freq: Every day | ORAL | Status: DC
  Filled 2019-05-29 (×8): qty 1

## 2019-05-29 MED ORDER — SEVELAMER CARBONATE 800 MG PO TABS
3200.0000 mg | ORAL_TABLET | Freq: Three times a day (TID) | ORAL | Status: DC
  Filled 2019-05-29 (×3): qty 4

## 2019-05-29 MED ORDER — GELATIN ABSORBABLE 12-7 MM EX MISC
CUTANEOUS | Status: AC
  Filled 2019-05-29: qty 1

## 2019-05-29 MED ORDER — SODIUM BICARBONATE 8.4 % IV SOLN
INTRAVENOUS | Status: DC
  Filled 2019-05-29 (×4): qty 150

## 2019-05-29 MED ORDER — HYDROCORTISONE NA SUCCINATE PF 100 MG IJ SOLR
50.0000 mg | Freq: Four times a day (QID) | INTRAMUSCULAR | Status: DC
  Administered 2019-05-29 – 2019-05-30 (×4): 50 mg via INTRAVENOUS
  Filled 2019-05-29 (×4): qty 2

## 2019-05-29 MED ORDER — HEPARIN SODIUM (PORCINE) 5000 UNIT/ML IJ SOLN
5000.0000 [IU] | Freq: Three times a day (TID) | INTRAMUSCULAR | Status: DC
  Administered 2019-05-30: 5000 [IU] via SUBCUTANEOUS
  Filled 2019-05-29 (×2): qty 1

## 2019-05-29 MED ORDER — SODIUM CHLORIDE 0.9 % IV SOLN
1.0000 g | Freq: Once | INTRAVENOUS | Status: AC
  Administered 2019-05-29: 1 g via INTRAVENOUS
  Filled 2019-05-29: qty 10

## 2019-05-29 MED ORDER — SEVELAMER CARBONATE 800 MG PO TABS
2400.0000 mg | ORAL_TABLET | ORAL | Status: DC | PRN
  Filled 2019-05-29: qty 3

## 2019-05-29 MED ORDER — VANCOMYCIN HCL 1250 MG/250ML IV SOLN
1250.0000 mg | INTRAVENOUS | Status: DC
  Filled 2019-05-29: qty 250

## 2019-05-29 MED ORDER — SODIUM CHLORIDE 0.9% FLUSH
10.0000 mL | Freq: Two times a day (BID) | INTRAVENOUS | Status: DC
  Administered 2019-05-29: 14:00:00 30 mL

## 2019-05-29 MED ORDER — FLUCONAZOLE IN SODIUM CHLORIDE 200-0.9 MG/100ML-% IV SOLN
200.0000 mg | Freq: Once | INTRAVENOUS | Status: AC
  Administered 2019-05-29: 200 mg via INTRAVENOUS
  Filled 2019-05-29: qty 100

## 2019-05-29 MED ORDER — CHLORHEXIDINE GLUCONATE 0.12% ORAL RINSE (MEDLINE KIT)
15.0000 mL | Freq: Two times a day (BID) | OROMUCOSAL | Status: DC
  Administered 2019-05-29 – 2019-05-30 (×2): 15 mL via OROMUCOSAL

## 2019-05-29 MED ORDER — SODIUM CHLORIDE 0.9 % IV SOLN
250.0000 mL | INTRAVENOUS | Status: DC | PRN
  Administered 2019-05-29 (×2): 250 mL via INTRAVENOUS

## 2019-05-29 MED ORDER — FENTANYL CITRATE (PF) 100 MCG/2ML IJ SOLN
INTRAMUSCULAR | Status: AC
  Filled 2019-05-29: qty 2

## 2019-05-29 MED ORDER — PIPERACILLIN-TAZOBACTAM IN DEX 2-0.25 GM/50ML IV SOLN
2.2500 g | Freq: Three times a day (TID) | INTRAVENOUS | Status: DC
  Administered 2019-05-29: 14:00:00 2.25 g via INTRAVENOUS
  Filled 2019-05-29 (×2): qty 50

## 2019-05-29 MED ORDER — TRAZODONE HCL 50 MG PO TABS: 25.0000 mg | ORAL_TABLET | Freq: Every evening | ORAL | Status: DC | PRN

## 2019-05-29 MED ORDER — PIPERACILLIN-TAZOBACTAM 3.375 G IVPB 30 MIN
3.3750 g | INTRAVENOUS | Status: AC
  Filled 2019-05-29: qty 50

## 2019-05-29 MED ORDER — ONDANSETRON HCL 4 MG/2ML IJ SOLN: 4.0000 mg | Freq: Four times a day (QID) | INTRAMUSCULAR | Status: DC | PRN

## 2019-05-29 MED ORDER — PIPERACILLIN-TAZOBACTAM 3.375 G IVPB
3.3750 g | Freq: Four times a day (QID) | INTRAVENOUS | Status: DC
  Administered 2019-05-29 – 2019-05-30 (×3): 3.375 g via INTRAVENOUS
  Filled 2019-05-29 (×4): qty 50

## 2019-05-29 MED ORDER — HEPARIN SODIUM (PORCINE) 1000 UNIT/ML IJ SOLN
INTRAMUSCULAR | Status: AC
  Filled 2019-05-29: qty 1

## 2019-05-29 MED ORDER — SODIUM CHLORIDE 0.9% FLUSH: 3.0000 mL | INTRAVENOUS | Status: DC | PRN

## 2019-05-29 MED ORDER — ONDANSETRON HCL 4 MG PO TABS: 4.0000 mg | ORAL_TABLET | Freq: Four times a day (QID) | ORAL | Status: DC | PRN

## 2019-05-29 MED ORDER — DEXTROSE 50 % IV SOLN
INTRAVENOUS | Status: AC
  Administered 2019-05-29: 11:00:00 50 mL via INTRAVENOUS
  Filled 2019-05-29: qty 50

## 2019-05-29 MED ORDER — SENNOSIDES-DOCUSATE SODIUM 8.6-50 MG PO TABS: 1.0000 | ORAL_TABLET | Freq: Every evening | ORAL | Status: DC | PRN

## 2019-05-29 MED ORDER — FENTANYL CITRATE (PF) 100 MCG/2ML IJ SOLN
50.0000 ug | Freq: Once | INTRAMUSCULAR | Status: AC
  Administered 2019-05-29: 11:00:00 50 ug via INTRAVENOUS

## 2019-05-29 MED ORDER — EPINEPHRINE HCL 5 MG/250ML IV SOLN IN NS
0.5000 ug/min | INTRAVENOUS | Status: DC
  Administered 2019-05-29: 0.5 ug/min via INTRAVENOUS
  Administered 2019-05-30 (×2): 10 ug/min via INTRAVENOUS
  Filled 2019-05-29 (×3): qty 250

## 2019-05-29 MED ORDER — SODIUM BICARBONATE 8.4 % IV SOLN
INTRAVENOUS | Status: AC
  Filled 2019-05-29: qty 100

## 2019-05-29 MED ORDER — LIDOCAINE HCL 1 % IJ SOLN
INTRAMUSCULAR | Status: AC
  Filled 2019-05-29: qty 20

## 2019-05-29 MED ORDER — IOHEXOL 300 MG/ML  SOLN
50.0000 mL | Freq: Once | INTRAMUSCULAR | Status: AC | PRN
  Administered 2019-05-29: 22:00:00 5 mL via INTRAVENOUS

## 2019-05-29 MED ORDER — CHLORHEXIDINE GLUCONATE CLOTH 2 % EX PADS
6.0000 | MEDICATED_PAD | Freq: Every day | CUTANEOUS | Status: DC
  Administered 2019-05-29 – 2019-05-30 (×2): 6 via TOPICAL

## 2019-05-29 MED ORDER — ORAL CARE MOUTH RINSE
15.0000 mL | OROMUCOSAL | Status: DC
  Administered 2019-05-29 – 2019-05-30 (×6): 15 mL via OROMUCOSAL

## 2019-05-29 MED ORDER — SODIUM CHLORIDE 0.9% FLUSH: 3.0000 mL | Freq: Two times a day (BID) | INTRAVENOUS | Status: DC

## 2019-05-29 MED ORDER — VASOPRESSIN 20 UNIT/ML IV SOLN
0.0500 [IU]/min | INTRAVENOUS | Status: DC
  Administered 2019-05-29: 0.04 [IU]/min via INTRAVENOUS
  Administered 2019-05-30: 0.05 [IU]/min via INTRAVENOUS
  Filled 2019-05-29 (×2): qty 2

## 2019-05-29 MED ORDER — PRO-STAT SUGAR FREE PO LIQD: 60.0000 mL | Freq: Three times a day (TID) | ORAL | Status: DC

## 2019-05-29 MED ORDER — NOREPINEPHRINE 4 MG/250ML-% IV SOLN
0.0000 ug/min | INTRAVENOUS | Status: DC
  Administered 2019-05-29: 18:00:00 13 ug/min via INTRAVENOUS
  Administered 2019-05-29: 12:00:00 2 ug/min via INTRAVENOUS
  Filled 2019-05-29 (×3): qty 250

## 2019-05-29 MED ORDER — PANTOPRAZOLE SODIUM 40 MG IV SOLR
40.0000 mg | Freq: Two times a day (BID) | INTRAVENOUS | Status: DC
  Administered 2019-05-30: 40 mg via INTRAVENOUS
  Filled 2019-05-29: qty 40

## 2019-05-29 MED ORDER — HEPARIN SODIUM (PORCINE) 1000 UNIT/ML IJ SOLN
INTRAMUSCULAR | Status: DC | PRN
  Administered 2019-05-29: 2.6 mL via INTRAVENOUS
  Administered 2019-05-29: 5200 [IU] via INTRAVENOUS
  Administered 2019-05-29: 2.6 mL via INTRAVENOUS

## 2019-05-29 MED ORDER — FLUCONAZOLE IN SODIUM CHLORIDE 200-0.9 MG/100ML-% IV SOLN
200.0000 mg | Freq: Once | INTRAVENOUS | Status: AC
  Administered 2019-05-29: 16:00:00 200 mg via INTRAVENOUS
  Filled 2019-05-29: qty 100

## 2019-05-29 MED ORDER — VITAL 1.5 CAL PO LIQD
1000.0000 mL | ORAL | Status: DC
  Filled 2019-05-29 (×2): qty 1000

## 2019-05-29 MED ORDER — ROCURONIUM BROMIDE 50 MG/5ML IV SOLN
50.0000 mg | Freq: Once | INTRAVENOUS | Status: AC
  Administered 2019-05-29: 11:00:00 50 mg via INTRAVENOUS

## 2019-05-29 MED ORDER — HEPARIN SODIUM (PORCINE) 1000 UNIT/ML DIALYSIS
1000.0000 [IU] | INTRAMUSCULAR | Status: DC | PRN
  Administered 2019-05-29 (×2): 3200 [IU] via INTRAVENOUS_CENTRAL
  Filled 2019-05-29 (×3): qty 6
  Filled 2019-05-29: qty 1
  Filled 2019-05-29: qty 6

## 2019-05-29 MED ORDER — PRO-STAT SUGAR FREE PO LIQD: 30.0000 mL | Freq: Two times a day (BID) | ORAL | Status: DC

## 2019-05-29 MED ORDER — FAMOTIDINE IN NACL 20-0.9 MG/50ML-% IV SOLN
20.0000 mg | Freq: Every day | INTRAVENOUS | Status: DC
  Administered 2019-05-29: 15:00:00 20 mg via INTRAVENOUS
  Filled 2019-05-29: qty 50

## 2019-05-29 MED ORDER — SODIUM BICARBONATE-DEXTROSE 150-5 MEQ/L-% IV SOLN: 150.0000 meq | INTRAVENOUS | Status: DC

## 2019-05-29 MED ORDER — FLUCONAZOLE 100MG IVPB: 100.0000 mg | INTRAVENOUS | Status: DC

## 2019-05-29 MED ORDER — VANCOMYCIN VARIABLE DOSE PER UNSTABLE RENAL FUNCTION (PHARMACIST DOSING): Status: DC

## 2019-05-29 MED ORDER — DEXTROSE 10 % IV SOLN: INTRAVENOUS | Status: DC

## 2019-05-29 MED ORDER — SODIUM BICARBONATE-DEXTROSE 150-5 MEQ/L-% IV SOLN
150.0000 meq | INTRAVENOUS | Status: DC
  Filled 2019-05-29: qty 1000

## 2019-05-29 MED ORDER — MIDAZOLAM HCL 2 MG/2ML IJ SOLN
1.0000 mg | Freq: Once | INTRAMUSCULAR | Status: AC
  Administered 2019-05-29: 11:00:00 1 mg via INTRAVENOUS

## 2019-05-29 MED ORDER — DEXTROSE 50 % IV SOLN
1.0000 | INTRAVENOUS | Status: AC
  Administered 2019-05-28: 50 mL via INTRAVENOUS

## 2019-05-29 MED ORDER — DEXTROSE 50 % IV SOLN
1.0000 | Freq: Once | INTRAVENOUS | Status: AC
  Administered 2019-05-29: 50 mL via INTRAVENOUS

## 2019-05-29 MED ORDER — ACETAMINOPHEN 325 MG PO TABS: 650.0000 mg | ORAL_TABLET | Freq: Four times a day (QID) | ORAL | Status: DC | PRN

## 2019-05-29 MED ORDER — LIDOCAINE HCL 1 % IJ SOLN
INTRAMUSCULAR | Status: DC | PRN
  Administered 2019-05-29: 10 mL

## 2019-05-29 MED ORDER — SODIUM CHLORIDE 0.9% FLUSH: 10.0000 mL | INTRAVENOUS | Status: DC | PRN

## 2019-05-29 MED ORDER — FLUCONAZOLE IN SODIUM CHLORIDE 400-0.9 MG/200ML-% IV SOLN
400.0000 mg | INTRAVENOUS | Status: DC
  Administered 2019-05-30: 400 mg via INTRAVENOUS
  Filled 2019-05-29: qty 200

## 2019-05-29 MED ORDER — GLUCAGON HCL RDNA (DIAGNOSTIC) 1 MG IJ SOLR
INTRAMUSCULAR | Status: AC
  Filled 2019-05-29: qty 1

## 2019-05-29 NOTE — Plan of Care (Signed)
  Problem: Clinical Measurements: Goal: Ability to maintain clinical measurements within normal limits will improve Outcome: Not Progressing   Problem: Activity: Goal: Risk for activity intolerance will decrease Outcome: Not Progressing

## 2019-05-29 NOTE — Significant Event (Signed)
Rapid Response Event Note  Overview: Decreased LOC, Hypoglycemia, Hypotension   Initial Focused Assessment: Called urgent to the bedside for patient being unresponsive. Upon arrival, patient was spontaneously breathing but unresponsive, he was profusely diaphoretic, clammy, and the staff checked a sugar prior to my arrival and it was 39, per staff, patient did not have peripheral IV access (+ central line present but it was not located in the right spot per staff so it was not initially used. Staff administered Glucagon 1 mg IM and there was not any improvement in this neurologic status, very weak gag, thick yellow- brown - bloody secretions in the back of his mouth. He was not protecting his airway. TRH MD was called urgently to the bedside along with VAST team, PIV was placed and with VAST RNs permission central line can be used as long as peripheral medications are utilized. TRH MD, Renal APP, PCCM team at the bedside. Blood improved to 91 and patient was placed on the NRB and transported to 2M05. SBP 70s in RUE and RLE, HR 90s, unable to obtain saturations but RR 18-24.   Interventions: -- Glucagon 1 mg IM -- Dextrose 50 mg IV   Plan of Care: -- Emergent transfer to ICU where patient underwent immediate intubation with PCCM team.   Event Summary:  Call Time [place] Time 1013 End Time [place]  John Petty R

## 2019-05-29 NOTE — Consult Note (Signed)
NAME:  John Petty, MRN:  [id number], DOB:  02/14/1981, LOS: 0 ADMISSION DATE:  06/23/2019, CONSULTATION DATE:  05/29/19 REFERRING MD:  Maren Beach, CHIEF COMPLAINT:  AMS   Brief History   39 year old man with ESRD on HD, longterm vascular access issues presenting with hypoglycemia and AMS.  History of present illness   39 year old man with ESRD on HD, longterm vascular access issues presenting after his right tunneled femoral HD catheter ripped out.  He was found to have signs of infection at his catheter site and was admitted for further workup of presumed sepsis.  This AM, he became progressively obtunded, hypoglycemic so PCCM consulted for further management.  History per chart review due to obtundation.  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 rate 20, height 5\' 4"  (1.626 m), weight 122.1 kg, SpO2 100 %.    Vent Mode: PRVC FiO2 (%):  [100 %] 100 % Set Rate:  [20 bmp] 20 bmp Vt Set:  [540 mL] 540 mL PEEP:  [5 cmH20] 5 cmH20 Plateau Pressure:  [24 cmH20] 24 cmH20   Intake/Output Summary (Last 24 hours) at 05/29/2019 1110 Last data filed at 05/29/2019 5427 Gross per 24 hour  Intake 0 ml  Output 0 ml  Net 0 ml   Filed Weights   05/29/19 0000  Weight: 122.1 kg    Examination: General: young man unresponsive HENT: MM dry, bloody dry secretions in back of throat Lungs: Tachypneic, shallow inspiratory efforts Cardiovascular: RRR, weak peripheral pulses Abdomen: Protuberant, soft Extremities: Diffuse anasarca, multiple palpable graft sites in both upper  ext Neuro: withdraws to pain Skin: intriginous skin breakdown, yeast, and weeping blood  Resolved Hospital Problem list   N/A  Assessment & Plan:  Acute respiratory failure- would suspect he is profoundly acidotic based on available labs - Intubate, ABG, CXR, VAP prevention bundle  Septic shock due to infected HD site- would try to avoid femoral area if at all possible going forward - Levophed, vasopressin, stress steroids  ESRD, difficult access- will try for percutaneous placement today  Hypoglycemia- related to sepsis, start TF, stress steroids, D50 PRN  Best practice:  Diet: Start TF Pain/Anxiety/Delirium protocol (if indicated): fentanyl for now VAP protocol (if indicated): Ordered 4/2 DVT prophylaxis: heparin GI prophylaxis: pepcid Glucose control: SSI Mobility: BR Code Status: Full Family Communication: Will reach out Disposition: ICU  Labs   CBC: Recent Labs  Lab 06/08/2019 2334 06/21/2019 2343 05/29/19 0752  WBC 25.2*  --  17.8*  NEUTROABS 22.6*  --   --   HGB 8.0* 9.5* 9.7*  HCT 26.6* 28.0* 31.5*  MCV 97.4  --  92.9  PLT 200  --  062    Basic Metabolic Panel: Recent Labs  Lab 06/09/2019 2334 06/13/2019 2343 05/29/19 0752  NA 135 133* 139  K 5.9* 5.9* 6.2*  CL 95* 102 99  CO2 13*  --  16*  GLUCOSE 184* 172* 90  BUN 70* 60* 72*  CREATININE 12.67* 13.10* 12.80*  CALCIUM 8.4*  --  8.7*   GFR: Estimated Creatinine Clearance:  9.2 mL/min (A) (by C-G formula based on SCr of 12.8 mg/dL (H)). Recent Labs  Lab 06/24/2019 2334 05/29/19 0752  WBC 25.2* 17.8*    Liver Function Tests: No results for input(s): AST, ALT, ALKPHOS, BILITOT, PROT, ALBUMIN in the last 168 hours. No results for input(s): LIPASE, AMYLASE in the last 168 hours. No results for input(s): AMMONIA in the last 168 hours.  ABG    Component Value Date/Time   TCO2 13 (L) 06/26/2019 2343     Coagulation Profile: Recent Labs  Lab 06/15/2019 2334  INR 1.8*    Cardiac Enzymes: No  results for input(s): CKTOTAL, CKMB, CKMBINDEX, TROPONINI in the last 168 hours.  HbA1C: Hgb A1c MFr Bld  Date/Time Value Ref Range Status  05/29/2019 07:52 AM 6.2 (H) 4.8 - 5.6 % Final    Comment:    (NOTE) Pre diabetes:          5.7%-6.4% Diabetes:              >6.4% Glycemic control for   <7.0% adults with diabetes     CBG: Recent Labs  Lab 05/29/19 0600 05/29/19 0638 05/29/19 1013 05/29/19 1033 05/29/19 1048  GLUCAP 65* 84 39* 91 72    Review of Systems:   Cannot assess due to acuity of condition  Past Medical History  He,  has a past medical history of Chronic kidney disease, Complication of anesthesia, History of blood transfusion, and Hypertension.   Surgical History    Past Surgical History:  Procedure Laterality Date  . ARTERIOVENOUS GRAFT PLACEMENT    . AV FISTULA PLACEMENT    . AV FISTULA PLACEMENT Left 05/07/2018   Procedure: INSERTION OF ARTERIOVENOUS (AV) GORE-TEX GRAFT LEFT THIGH;  Surgeon: Serafina Mitchell, MD;  Location: [facility];  Service: Vascular;  Laterality: Left;  . AVF    . INSERTION OF DIALYSIS CATHETER    . IR FLUORO GUIDE CV LINE LEFT  12/25/2018  . THROMBECTOMY AND REVISION OF ARTERIOVENTOUS (AV) GORETEX  GRAFT Left 05/15/2018   Procedure: THROMBECTOMY OF ARTERIOVENTOUS (AV) GORETEX  GRAFT LEFT THIGH;  Surgeon: Serafina Mitchell, MD;  Location: [facility];  Service: Vascular;  Laterality: Left;  . TONSILLECTOMY       Social History   reports that he has never smoked. He has never used smokeless tobacco. He reports current alcohol use. He reports previous drug use.   Family History   His family history includes CVA in his father and mother; Heart disease in his father and mother; Kidney disease in his father.   Allergies No Known Allergies   Home Medications  Prior to Admission medications   Medication Sig Start Date End Date Taking? Authorizing Provider  oxyCODONE-acetaminophen (PERCOCET/ROXICET) 5-325 MG tablet Take 1 tablet by mouth every  6 (six) hours as needed. Patient not taking: Reported on 05/29/2019 05/15/18   Ulyses Amor, PA-C  sevelamer carbonate (RENVELA) 800 MG tablet Take 2,400-3,200 mg by mouth See admin instructions. Take 4 tablets (3200 mg) by mouth with each meal & take 3 tablets (2400 mg) by mouth with snacks    [provider]     Critical care time: 40 minutes

## 2019-05-29 NOTE — Progress Notes (Addendum)
PCCM Overnight    Called by E-link to assess previous placed Left IJ Trialysis due to it not working  Initially a Left IJ TLC was placed in ED by EDP at 0200 on 4/2 and position was noted post insertion on CXR to be in  the intercostal vein.  Pt was evaluated by Vascular who stated that temporary HD cath could be placed inLeft IJ. Vascular planned for upper extremity vein mapping to see if pt had any remaining upper extremity options and if not may require a trans Lumbar catheter. Per their recommendations Left IJ TLC was exchanged over guidewire to a Left Trialysis catheter  He was started on CRRT at 1500 on 04/02 Per bedside ICU RN the catheter flushed but has no drawback approximately  At 1830 On CRRT it had increased pressures.   In addition he has a h/o SVC stent, known occlusion of the right innominate vein and a previous tunneled right femoral catheter placed by Vascular which per ED Triage note was accidentally removed on 04/01: Patient was walking when his dialysis catheter in his right thigh got caught in a stair/hand rail and it was removed.  On evaluation there was purulent drainage from the right femoral tunneled site/   IR was called by Kennis Carina to  IR Physician who stated that they called for pt to be brought for access at 4 pm and pt was not sent. Per Vascular and PCCM>> right Femoral HD site is suspected source of infection and was avoided for access earlier. Given SVC stent IR physician suspects that pt may have central obstruction. Discussed with IR attending--> they will come in to provide IR guided HD access most likely in left femoral The right and left intertriginous areas of the groin have epidermal breakdown and  Possible superficial yeast infection.   Marland Kitchen.Signed Dr Seward [facility]

## 2019-05-29 NOTE — Progress Notes (Signed)
New Admission Note:   Arrival Method: ED via stretcher Mental Orientation: disoriented x4 Telemetry: 67m06, CCMD notified Assessment: Completed Skin: Bleeding MASD on his  Bilateral armpit, groin, abdominal folds, sacral area IV:  Left subclavian central line, SL Pain: pt. unable to verbalize Tubes: None Safety Measures: Safety Fall Prevention Plan has been discussed  Admission: to be completed 5 [place] Orientation: Patient has been orientated to the room, unit and staff.   Family: none at bedside  Orders to be reviewed and implemented. Will continue to monitor the patient. Call light has been placed within reach and bed alarm has been activated.

## 2019-05-29 NOTE — Procedures (Signed)
Arterial Catheter Insertion Procedure Note Adrienne Delay [id number] 06-06-1980  Procedure: Insertion of Arterial Catheter  Indications: Blood pressure monitoring and Frequent blood sampling  Procedure Details Consent: Unable to obtain consent because of emergent medical necessity. Time Out: Verified patient identification, verified procedure, site/side was marked, verified correct patient position, special equipment/implants available, medications/allergies/relevent history reviewed, required imaging and test results available.  Performed  Maximum sterile technique was used including antiseptics, cap, gloves, gown, hand hygiene, mask and sheet. Skin prep: Chlorhexidine; local anesthetic administered 20 gauge catheter was inserted into rt brachial artery using the Seldinger technique.  Evaluation Blood flow good; BP tracing good. Complications: No apparent complications.   Richardson Landry Shauntavia Brackin ACNP Maryanna Shape PCCM Pager [phone] till 3 pm If no answer page [phone] 05/29/2019, 11:58 AM

## 2019-05-29 NOTE — Consult Note (Signed)
[facility] Renal Consultation Note    Indication for Consultation:  Management of ESRD/hemodialysis; anemia, hypertension/volume and secondary hyperparathyroidism PCP: Francine Graven NP  HPI: John Petty is a 39 y.o. 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 PMH: Morbid obesity, DM, HTN-now chronic hypotension, SHPT, AOCD. He is a catheter dependent HD patient S/P 14 failed accesses. Last HD 05/26/2019. He left 10.7 kg above OP EDW! He was supposed to return next day for extra treatment but did not show up for appointment.  Per HPI, patient was walking and R femoral was dislodged. EMS was called who reported 1 liter of blood surrounding patient upon arrival to scene. He was found to be hypoglycemic BS 20. HGB 8.0 on arrival to ED. He has received 1 units PRBCs. SCr 12.67 BUN 70 on arrival. Since admission he has become progressively more confused, currently not responding to verbal stimuli, has been placed in 4 point restraints D/T agitation.  Last  SCr 12.8 BUN 72 K+ 6.2. He is unable to contribute to HPI at present. Pannus is excoriated, bleeding,  Possible intertriginous dermatitis present. Seen by [facility] PA for HD access.  has purulent drainage from former R femoral TDC site. WBC  17.8.   Past Medical History:  Diagnosis Date  . Chronic kidney disease    esrd  . Complication of anesthesia    IV- site burned and itching- one time event  . History of blood transfusion   . Hypertension    Past Surgical History:  Procedure Laterality Date  . ARTERIOVENOUS GRAFT PLACEMENT    . AV FISTULA PLACEMENT    . AV FISTULA PLACEMENT Left 05/07/2018   Procedure: INSERTION OF ARTERIOVENOUS (AV) GORE-TEX GRAFT LEFT THIGH;  Surgeon: Serafina Mitchell, MD;  Location: [place];  Service: Vascular;  Laterality: Left;  . AVF    . INSERTION OF DIALYSIS CATHETER    . IR FLUORO GUIDE CV LINE LEFT  12/25/2018  . THROMBECTOMY AND REVISION OF ARTERIOVENTOUS (AV) GORETEX   GRAFT Left 05/15/2018   Procedure: THROMBECTOMY OF ARTERIOVENTOUS (AV) GORETEX  GRAFT LEFT THIGH;  Surgeon: Serafina Mitchell, MD;  Location: [place];  Service: Vascular;  Laterality: Left;  . TONSILLECTOMY     Family History  Problem Relation Age of Onset  . Heart disease Mother   . CVA Mother   . Heart disease Father   . CVA Father   . Kidney disease Father    Social History:  reports that he has never smoked. He has never used smokeless tobacco. He reports current alcohol use. He reports previous drug use. No Known Allergies Prior to Admission medications   Medication Sig Start Date End Date Taking? Authorizing Provider  oxyCODONE-acetaminophen (PERCOCET/ROXICET) 5-325 MG tablet Take 1 tablet by mouth every 6 (six) hours as needed. Patient not taking: Reported on 05/29/2019 05/15/18   Ulyses Amor, PA-C  sevelamer carbonate (RENVELA) 800 MG tablet Take 2,400-3,200 mg by mouth See admin instructions. Take 4 tablets (3200 mg) by mouth with each meal & take 3 tablets (2400 mg) by mouth with snacks    [provider]   Current Facility-Administered Medications  Medication Dose Route Frequency Provider Last Rate Last Admin  . 0.9 %  sodium chloride infusion  250 mL Intravenous PRN Mansy, Jan A, MD 10 mL/hr at 05/29/19 0618 250 mL at 05/29/19 0618  . acetaminophen (TYLENOL) tablet 650 mg  650 mg Oral Q6H PRN Mansy, Arvella Merles, MD  Or  . acetaminophen (TYLENOL) suppository 650 mg  650 mg Rectal Q6H PRN Mansy, Jan A, MD      . Chlorhexidine Gluconate Cloth 2 % PADS 6 each  6 each Topical Q0600 Claudia Desanctis, MD      . dextrose 50 % solution           . glucagon (human recombinant) (GLUCAGEN) 1 MG injection           . ondansetron (ZOFRAN) tablet 4 mg  4 mg Oral Q6H PRN Mansy, Jan A, MD       Or  . ondansetron [facility]) injection 4 mg  4 mg Intravenous Q6H PRN Mansy, Jan A, MD      . senna-docusate (Senokot-S) tablet 1 tablet  1 tablet Oral QHS PRN Mansy, Jan A, MD      . sevelamer  carbonate (RENVELA) tablet 2,400 mg  2,400 mg Oral PRN Mansy, Jan A, MD      . sevelamer carbonate (RENVELA) tablet 3,200 mg  3,200 mg Oral TID WC Mansy, Jan A, MD      . sodium chloride flush (NS) 0.9 % injection 10-40 mL  10-40 mL Intracatheter Q12H Kc, Ramesh, MD      . sodium chloride flush (NS) 0.9 % injection 10-40 mL  10-40 mL Intracatheter PRN Kc, Ramesh, MD      . sodium chloride flush (NS) 0.9 % injection 3 mL  3 mL Intravenous Q12H Mansy, Jan A, MD      . sodium chloride flush (NS) 0.9 % injection 3 mL  3 mL Intravenous PRN Mansy, Jan A, MD      . sodium polystyrene (KAYEXALATE) 15 GM/60ML suspension 15 g  15 g Oral Once Mansy, Jan A, MD      . traZODone (DESYREL) tablet 25 mg  25 mg Oral QHS PRN Mansy, Jan A, MD       Labs: Basic Metabolic Panel: Recent Labs  Lab 06/14/2019 2334 06/15/2019 2343  NA 135 133*  K 5.9* 5.9*  CL 95* 102  CO2 13*  --   GLUCOSE 184* 172*  BUN 70* 60*  CREATININE 12.67* 13.10*  CALCIUM 8.4*  --    Liver Function Tests: No results for input(s): AST, ALT, ALKPHOS, BILITOT, PROT, ALBUMIN in the last 168 hours. No results for input(s): LIPASE, AMYLASE in the last 168 hours. No results for input(s): AMMONIA in the last 168 hours. CBC: Recent Labs  Lab 06/07/2019 2334 05/29/2019 2343 05/29/19 0752  WBC 25.2*  --  17.8*  NEUTROABS 22.6*  --   --   HGB 8.0* 9.5* 9.7*  HCT 26.6* 28.0* 31.5*  MCV 97.4  --  92.9  PLT 200  --  173   Cardiac Enzymes: No results for input(s): CKTOTAL, CKMB, CKMBINDEX, TROPONINI in the last 168 hours. CBG: Recent Labs  Lab 05/29/19 0402 05/29/19 0404 05/29/19 0558 05/29/19 0600 05/29/19 0638  GLUCAP 64* 80 60* 65* 84   Iron Studies: No results for input(s): IRON, TIBC, TRANSFERRIN, FERRITIN in the last 72 hours. Studies/Results: CT Head Wo Contrast  Result Date: 05/29/2019 CLINICAL DATA:  Speech difficulty EXAM: CT HEAD WITHOUT CONTRAST TECHNIQUE: Contiguous axial images were obtained from the base of the skull  through the vertex without intravenous contrast. COMPARISON:  None. FINDINGS: Brain: No evidence of acute infarction, hemorrhage, hydrocephalus, extra-axial collection or mass lesion/mass effect. Global cortical atrophy. Vascular: No hyperdense vessel or unexpected calcification. Skull: Normal. Negative for fracture or focal lesion. 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 Sinuses/Orbits: The visualized paranasal sinuses are essentially clear. The mastoid air cells are unopacified. Other: None. IMPRESSION: No evidence of acute intracranial abnormality. Global cortical atrophy. Electronically Signed   By: Julian Hy M.D.   On: 05/29/2019 00:58   DG Chest Portable 1 View  Result Date: 05/29/2019 CLINICAL DATA:  Central line placement EXAM: PORTABLE CHEST 1 VIEW COMPARISON:  None. FINDINGS: Lungs are clear.  No pleural effusion or pneumothorax. The heart is normal in size.  Right SVC stent. Left IJ venous catheter courses along the left mediastinum and likely terminates within a left intercostal vein. IMPRESSION: Left IJ venous catheter courses along the left mediastinum and likely terminates within the left intracostal vein. Right SVC stent. Electronically Signed   By: Julian Hy M.D.   On: 06/22/2019 23:48    ROS: As per HPI otherwise negative.  Physical Exam: Vitals:   05/29/19 0413 05/29/19 0456 05/29/19 0531 05/29/19 0835  BP:   (!) 107/54 (!) 82/47  Pulse:   (!) 115 (!) 113  Resp: (!) 26 (!) 22 20 (!) 34  Temp:   98.1 F (36.7 C)   TempSrc:      SpO2:   93%   Weight:      Height:         General: Obese, chronically ill disheveled male in 4 point restraints.  Head: Normocephalic, atraumatic, sclera non-icteric, mucus membranes are dry. Neck: Supple. JVD not elevated. Lungs: Clear bilaterally to auscultation without wheezes, rales, or rhonchi. Breathing is unlabored. Heart: RRR with S1 S2. No murmurs, rubs, or gallops appreciated. Abdomen: Soft, non-tender, non-distended with  normoactive bowel sounds. No rebound/guarding. No obvious abdominal masses. Pannus excoriated, open wounds.  M-S:  Strength and tone appear normal for age. Lower extremities: No LE edema.  Skin: Excoriated bleeding areas R and L groin, pannus. Multiple hyperpigmented lesions on UE, trunk legs.  Neuro: Not responding to verbal stimuli or following commands.  Dialysis Access: No HD access at present  Dialysis Orders: [facility] T,Th,S 4.5 hr 200NRe 400/800 121kg 1.0K/2.5 Ca -Heparin 12,000 units IV TIW  -Hectorol 8 mcg IV TIW -Mircera 75 mcg IV q 2 weeks (last dose 05/26/19 Last HGB 10.4 05/20/19) -Venofer 50 mg IV q week (last dose 05/21/19)  Assessment/Plan: 1.  ABLA D/T dislodged R Femoral TDC-HGB  8.0 on arrival to ED. Has rec'd 1 units PRBCs 2.  Sepsis: WBC 25.2 on admit, WBC  17.8. Has yeast growing in both groins, under breasts, pannus excoriated. Discussed with primary. Has been discussed with ID. Unfortunately no IV access at present for ABX. IR consulted for IV access. Will get [facility] in HD if unable to draw on unit. 3. Loss of HD access: Seen by [facility]VS PA, unable to place [facility] in femoral area D/T excoriated skin, purulent drainage from former [facility] site. Have consulted IR for Temp catheter. Patient has H/O multiple failed AV accesses. Has been catheter dependent since he has been at OP HD center.  4.  AMS: Initially thought to be D/T hypoglycemia, now possible sepsis, uremia. CT of head without acute abnormality.  5. Hyperkalemia-Chronic issue. K+ 6.2 this AM. Pt unable to take PO meds, no IV access. No HD access. Will have urgent HD when T cath placed in IR. Follow labs.  6.  ESRD - T,Th, S Last HD 03/30 left 10 kg above OP EDW. Was supposed to have extra treatment 06/02/2019 but did not show up for treatment. Urgent HD today when HD access placed.  7.  Hypertension/volume  - left 10 kg above OP EDW last treatment. Fortunately CXR unremarkable, no LE edema, lungs clear. Will need serial HD for volume  removal.  8.  Anemia  - As noted above.  9.  Metabolic bone disease -  Ca 8.7 Add RFP to today's labs. Chronic issues with hyperphosphatemia as OP. NPO at represent 9.    Nutrition - NPO at present, Renal/Carb mod diet when able to eat. 10.  DM-per primary   Jimmye Norman. Owens Shark, NP-C 05/29/2019, 9:02 AM  [employer] [phone]

## 2019-05-29 NOTE — ED Notes (Signed)
Second unit of blood completed no adverse reaction noticed.

## 2019-05-29 NOTE — Progress Notes (Addendum)
Pharmacy Antibiotic Note  John Petty is a 39 y.o. male admitted on 06/11/2019 with acute onset of bleeding from his right groin due to dislodged R Femoral TDC-HGB  8.0 on arrival to ED. Has rec'd 1 units PRBCs.  On 4/2 Pharmacy has been consulted for Zosyn and Vancomycin dosing for sepsis. PMH includes ESRD-HD usual HD schedule TTS.  He also was noted to have yeast growing in both groins, under breasts, pannus excoriated> MD started Fluconazole IV.   Patient started on CRRT. Patient with fluid volume overload and reported as 10 kg above EDW last treatment, used ~110 kg for dry weight.  Plan: Zosyn 3.375 g IV x1 over 30 min then 3.375 g IV Q6H Vancomycin 2000 mg IV LD x1 now, then 1250 mg IV Q24H Fluconazole 400 mg IV Q24H Monitor clinical status, renal function and culture results daily.   Height: 5\' 4"  (162.6 cm) Weight: 122.1 kg (269 lb 2.9 oz) IBW/kg (Calculated) : 59.2  Temp (24hrs), Avg:97.5 F (36.4 C), Min:96.8 F (36 C), Max:98.1 F (36.7 C)  Recent Labs  Lab 06/14/2019 2334 06/01/2019 2343 05/29/19 0752  WBC 25.2*  --  17.8*  CREATININE 12.67* 13.10* 12.86*  12.80*    Estimated Creatinine Clearance: 9.2 mL/min (A) (by C-G formula based on SCr of 12.8 mg/dL (H)).    No Known Allergies  Antimicrobials this admission: Zosyn 4/2>> Vanc 4/2>> Fluconazole 4/2>>   Dose adjustments this admission: Abx adjusted based on CRRT dosing   Microbiology results: 4/2 wound cx: ordered 4/2 covid: neg, flu:neg 4/2 MRSA PCR: negative 4/1 covid: neg  Lorel Monaco, PharmD PGY1 Ambulatory Care Resident [employer] # [phone]

## 2019-05-29 NOTE — ED Provider Notes (Signed)
[facility] EMERGENCY DEPARTMENT Provider Note   CSN: [id number] Arrival date & time: 06/05/2019  2252     History Chief Complaint  Patient presents with  . Vascular Access Problem    Castle Lamons is a 39 y.o. male.  Patient presents to the emergency department with a chief complaint of bleeding.  He states that he was walking and his dialysis catheter located in his right groin got caught hand rail, and was torn out of his leg.  EMS reports that there was approximately 1 L of blood surrounding the patient on arrival.  Bleeding was controlled with pressure and there was no bleeding upon arrival to the emergency department.  Patient complains of feeling cold and tired.  Level 5 caveat secondary to acuity of condition.  The history is provided by the patient. No language interpreter was used.       Past Medical History:  Diagnosis Date  . Chronic kidney disease    esrd  . Complication of anesthesia    IV- site burned and itching- one time event  . History of blood transfusion   . Hypertension     Patient Active Problem List   Diagnosis Date Noted  . ESRD on dialysis [facility]) 05/07/2018    Past Surgical History:  Procedure Laterality Date  . ARTERIOVENOUS GRAFT PLACEMENT    . AV FISTULA PLACEMENT    . AV FISTULA PLACEMENT Left 05/07/2018   Procedure: INSERTION OF ARTERIOVENOUS (AV) GORE-TEX GRAFT LEFT THIGH;  Surgeon: Serafina Mitchell, MD;  Location: [place];  Service: Vascular;  Laterality: Left;  . AVF    . INSERTION OF DIALYSIS CATHETER    . IR FLUORO GUIDE CV LINE LEFT  12/25/2018  . THROMBECTOMY AND REVISION OF ARTERIOVENTOUS (AV) GORETEX  GRAFT Left 05/15/2018   Procedure: THROMBECTOMY OF ARTERIOVENTOUS (AV) GORETEX  GRAFT LEFT THIGH;  Surgeon: Serafina Mitchell, MD;  Location: [place];  Service: Vascular;  Laterality: Left;  . TONSILLECTOMY         Family History  Problem Relation Age of Onset  . Heart disease Mother   . CVA Mother   . Heart  disease Father   . CVA Father   . Kidney disease Father     Social History   Tobacco Use  . Smoking status: Never Smoker  . Smokeless tobacco: Never Used  Substance Use Topics  . Alcohol use: Yes    Comment: maybe 1 mixed drink 2 times week  . Drug use: Not Currently    Home Medications Prior to Admission medications   Medication Sig Start Date End Date Taking? Authorizing Provider  amLODipine (NORVASC) 5 MG tablet Take 5 mg by mouth daily. Usually take in the evening 04/23/18   [provider]  oxyCODONE-acetaminophen (PERCOCET/ROXICET) 5-325 MG tablet Take 1 tablet by mouth every 6 (six) hours as needed. 05/15/18   Ulyses Amor, PA-C  sevelamer carbonate (RENVELA) 800 MG tablet Take 2,400-3,200 mg by mouth See admin instructions. Take 4 tablets (3200 mg) by mouth with each meal & take 3 tablets (2400 mg) by mouth with snacks    [provider]  zolpidem (AMBIEN) 10 MG tablet Take 10 mg by mouth at bedtime as needed. 03/22/19   [provider]    Allergies    Patient has no known allergies.  Review of Systems   Review of Systems  Unable to perform ROS: Acuity of condition    Physical Exam Updated Vital Signs BP Marland Kitchen)  83/45   Pulse (!) 107   Temp (!) 96.8 F (36 C) (Tympanic)   Resp (!) 34   Ht 5\' 4"  (1.626 m)   Wt 122.1 kg   SpO2 94%   BMI 46.20 kg/m   Physical Exam Vitals and nursing note reviewed.  Constitutional:      Appearance: He is well-developed.  HENT:     Head: Normocephalic and atraumatic.  Eyes:     Conjunctiva/sclera: Conjunctivae normal.  Cardiovascular:     Rate and Rhythm: Regular rhythm. Tachycardia present.     Heart sounds: No murmur.  Pulmonary:     Effort: No respiratory distress.     Breath sounds: Normal breath sounds.     Comments: Tachypneic Abdominal:     Palpations: Abdomen is soft.     Tenderness: There is no abdominal tenderness.  Musculoskeletal:     Cervical back: Neck supple.  Skin:     General: Skin is warm and dry.     Coloration: Skin is pale.     Comments: Tunnel for dialysis catheter and right upper thigh, no active bleeding  Bilateral lower extremities are covered in dried blood  Extremities are cold to touch  Neurological:     Mental Status: He is alert and oriented to person, place, and time.  Psychiatric:        Mood and Affect: Mood normal.        Behavior: Behavior normal.     ED Results / Procedures / Treatments   Labs (all labs ordered are listed, but only abnormal results are displayed) Labs Reviewed  PROTIME-INR - Abnormal; Notable for the following components:      Result Value   Prothrombin Time 20.7 (*)    INR 1.8 (*)    All other components within normal limits  APTT - Abnormal; Notable for the following components:   aPTT 41 (*)    All other components within normal limits  FIBRINOGEN - Abnormal; Notable for the following components:   Fibrinogen 664 (*)    All other components within normal limits  CBG MONITORING, ED - Abnormal; Notable for the following components:   Glucose-Capillary 20 (*)    All other components within normal limits  CBG MONITORING, ED - Abnormal; Notable for the following components:   Glucose-Capillary 14 (*)    All other components within normal limits  CBG MONITORING, ED - Abnormal; Notable for the following components:   Glucose-Capillary 17 (*)    All other components within normal limits  I-STAT CHEM 8, ED - Abnormal; Notable for the following components:   Sodium 133 (*)    Potassium 5.9 (*)    BUN 60 (*)    Creatinine, Ser 13.10 (*)    Glucose, Bld 172 (*)    Calcium, Ion 0.98 (*)    TCO2 13 (*)    Hemoglobin 9.5 (*)    HCT 28.0 (*)    All other components within normal limits  CBG MONITORING, ED - Abnormal; Notable for the following components:   Glucose-Capillary 13 (*)    All other components within normal limits  SARS CORONAVIRUS 2 (TAT 6-24 HRS)  CBC WITH DIFFERENTIAL/PLATELET  BASIC  METABOLIC PANEL  TYPE AND SCREEN  ABO/RH    EKG None  Radiology DG Chest Portable 1 View  Result Date: 06/18/2019 CLINICAL DATA:  Central line placement EXAM: PORTABLE CHEST 1 VIEW COMPARISON:  None. FINDINGS: Lungs are clear.  No pleural effusion or pneumothorax. The heart is normal  in size.  Right SVC stent. Left IJ venous catheter courses along the left mediastinum and likely terminates within a left intercostal vein. IMPRESSION: Left IJ venous catheter courses along the left mediastinum and likely terminates within the left intracostal vein. Right SVC stent. Electronically Signed   By: Julian Hy M.D.   On: 06/24/2019 23:48    Procedures .Critical Care Performed by: Montine Circle, PA-C Authorized by: Montine Circle, PA-C   Critical care provider statement:    Critical care time (minutes):  55   Critical care was necessary to treat or prevent imminent or life-threatening deterioration of the following conditions:  Metabolic crisis and circulatory failure   Critical care was time spent personally by me on the following activities:  Discussions with consultants, evaluation of patient's response to treatment, examination of patient, ordering and performing treatments and interventions, ordering and review of laboratory studies, ordering and review of radiographic studies, pulse oximetry, re-evaluation of patient's condition, obtaining history from patient or surrogate and review of old charts   (including critical care time)  Medications Ordered in ED Medications  glucagon (human recombinant) (GLUCAGEN) 1 MG injection ( Intramuscular Canceled Entry 05/29/2019 2354)  0.9 %  sodium chloride infusion (Manually program via Guardrails IV Fluids) ( Intravenous New Bag/Given 06/09/2019 2350)  dextrose 50 % solution (  Given 06/24/2019 2314)  dextrose 50 % solution 50 mL (50 mLs Intravenous Given 05/29/2019 2340)    ED Course  I have reviewed the triage vital signs and the nursing  notes.  Pertinent labs & imaging results that were available during my care of the patient were reviewed by me and considered in my medical decision making (see chart for details).    MDM Rules/Calculators/A&P                      Patient had right thigh dialysis catheter pulled out when he got caught on a handrail.  There was reportedly a significant amount of bleeding, EMS estimates about 1 L of blood loss.  There was no further bleeding upon arrival to the emergency department.  Patient's blood pressure was initially 100/59, subsequent pressure was 55/29.  CBG is noted to be 20.  I looked briefly for good access with ultrasound, but was unable to quickly find peripheral access, and called Dr. Roxanne Mins to the bedside for placement of of central venous line.  Dr. Roxanne Mins was successful with placing the CVC.  Patient was given IM glucose along with an amp and dextrose after establishing the CBC.  He is receiving emergent blood transfusion given his reported significant blood loss, pale appearance, cold extremities.  Patient will need admission to the hospital.  Patient is glucose is stabilizing.  Hemoglobin is 8.0, was last 12.6.  Patient was given 2 units of blood.  His mental status has improved since his glucose has come up.  He still has some confusion, but is markedly improved from when he first arrived.  Patient will need admission for monitoring of H/H.  K is 5.9, will need nephro consult for possible dialysis.  Will need consult with vascular as well for replacement of HD catheter.  Appreciate Dr. Sidney Ace for admitting.   Final Clinical Impression(s) / ED Diagnoses Final diagnoses:  Bleeding  Hyperkalemia  Hypoglycemia  Problem with vascular access    Rx / DC Orders ED Discharge Orders    None       Montine Circle, PA-C 44/31/54 0086    Delora Fuel, MD 76/19/50  0424  

## 2019-05-29 NOTE — ED Notes (Signed)
Report attempted, RN to call back. 

## 2019-05-29 NOTE — Progress Notes (Signed)
Critical ABG values given to Dr. D. Smith, CCM. 

## 2019-05-29 NOTE — Progress Notes (Signed)
Patient with newly inserted HD catheter for CRRT. Line started malfunctioning at end of day shift. Day shift RN worked to troubleshoot with prismaflex, without improvement. Lines will flush but will not draw back at this time. Called [facility] and Nephrology to notify. Per [facility] team, patient was difficult insertion and would suggest IR reinsert a line. Orders placed and call placed to IR by [facility] team. LIne currently heparin locked until can be fixed.  Milford Cage, RN

## 2019-05-29 NOTE — ED Notes (Signed)
2ndo unit of blood started per Dr. Roxanne Mins and Montine Circle, PA order, pt still confuse and unable to give consent at this time.

## 2019-05-29 NOTE — Progress Notes (Signed)
Initial Nutrition Assessment  DOCUMENTATION CODES:   Morbid obesity  INTERVENTION:    Vital 1.5 at 35 ml/h (840 ml per day)   Pro-stat 60 ml TID   Provides 1860 kcal, 147 gm protein, 642 ml free water daily   NUTRITION DIAGNOSIS:   Increased nutrient needs related to chronic illness(ESRD on HD, now on CRRT) as evidenced by estimated needs.  GOAL:   Provide needs based on ASPEN/SCCM guidelines  MONITOR:   Vent status, TF tolerance, Labs, Skin, I & O's  REASON FOR ASSESSMENT:   Ventilator, Consult Enteral/tube feeding initiation and management  ASSESSMENT:   39 yo male admitted with presumed sepsis after his R tunneled femoral HD catheter was dislodged. Developed hypoglycemia, AMS, and required intubation 4/2. PMH includes ESRD on HD, long term vascular access issues, HTN.   Received MD Consult for TF initiation and management. OG tube in place.  Per review of Nephrology notes, patient's last HD was 3/30 and he left 10.7 kg above EDW. He is now requiring CRRT.  Patient is currently intubated on ventilator support MV: 8.7 L/min Temp (24hrs), Avg:97.5 F (36.4 C), Min:96.8 F (36 C), Max:98.1 F (36.7 C)    Labs reviewed. Na 134 (L), K 6 (H), BUN 72 (H), creat 12.8 (H), Ionized calcium 1.13 (L) CBG's: 60-65-84-39-91-72-69  Medications reviewed and include solu cortef, Novolog, Renvela, Kayexalate, levophed, vasopressin.   NUTRITION - FOCUSED PHYSICAL EXAM:  unable to complete  Diet Order:   Diet Order            Diet NPO time specified Except for: Sips with Meds  Diet effective now              EDUCATION NEEDS:   Not appropriate for education at this time  Skin:  Skin Assessment: Skin Integrity Issues:(numerous full thickness wounds in multiple body locations:  Posterior neck, sacrum/buttocks, bilat axillary, subpannicular and bilateral groin and scrotum areas)  Last BM:  no BM documented  Height:   Ht Readings from Last 1 Encounters:   05/29/19 5\' 4"  (1.626 m)    Weight:   Wt Readings from Last 1 Encounters:  05/29/19 122.1 kg    Ideal Body Weight:  59.1 kg  BMI:  Body mass index is 46.2 kg/m.  Estimated Nutritional Needs:   Kcal:  1350-1710  Protein:  135-148 gm  Fluid:  1 L + UOP   Molli Barrows, RD, LDN, CNSC Please refer to Amion for contact information.

## 2019-05-29 NOTE — Progress Notes (Signed)
Pharmacy Antibiotic Note  John Petty is a 39 y.o. male admitted on 06/23/2019 with acute onset of bleeding from his right groin due to dislodged R Femoral TDC-HGB  8.0 on arrival to ED. Has rec'd 1 units PRBCs.  On 4/2  Pharmacy has been consulted for Zosyn and Vancomycin dosing for sepsis.  ESRD-HD usual HD schedule TTS.   He also wsa oted to have yeast growing in both groins, under breasts, pannus excoriated> MD started Flonazole IV.   Plan: Zosyn 3.375 g IV x1 over 30 min STAT then 2.25 g IV q8h  Vancomycin 2000 mg IV LD x1 now,  then f/u on HD plans, and order vancomyci post next HD -pending HD plan. Monitor clinical status, renal function and culture results daily. Vanc pre HD level per protocol Goal: Vanc level pre HD (goal 15-25)    Height: 5\' 4"  (162.6 cm) Weight: 122.1 kg (269 lb 2.9 oz) IBW/kg (Calculated) : 59.2  Temp (24hrs), Avg:97.5 F (36.4 C), Min:96.8 F (36 C), Max:98.1 F (36.7 C)  Recent Labs  Lab 05/31/2019 2334 06/21/2019 2343 05/29/19 0752  WBC 25.2*  --  17.8*  CREATININE 12.67* 13.10* 12.80*    Estimated Creatinine Clearance: 9.2 mL/min (A) (by C-G formula based on SCr of 12.8 mg/dL (H)).    No Known Allergies  Antimicrobials this admission: Zosyn 4/2>> Vanc 4/2>> Fluconazole 4/2>>   Dose adjustments this admission:   Microbiology results: 4/2 wound cx: ordered 4/2 covid: neg, flu:neg 4/2 MRSA PCR: sent 4/1 covid: neg   Thank you for allowing pharmacy to be a part of this patient's care. Nicole Cella, RPh Clinical Pharmacist [phone] Please check AMION for all [facility] phone numbers After 10:00 PM, call [facility] [phone] 05/29/2019 10:40 AM

## 2019-05-29 NOTE — Progress Notes (Signed)
Wasted 1 mg Versed Wasted 50 of Fentanyl In Steri With Thersa Salt, RN

## 2019-05-29 NOTE — Progress Notes (Signed)
eLink Physician-Brief Progress Note Patient Name: John Petty DOB: [birth year] MRN: [id number]   Date of Service  05/29/2019  HPI/Events of Note  Patient persistently tachycardic 160s on epi, norepi and vasopressin. MAP 50s. Appears wide complex on telemetry which he has been on since ROSC. ON going HD, positive 230/hr net  eICU Interventions   Calcium chloride with no change in rhythm  Ordered another bicarb push and rhythm converted to sinus.  Also ordered another D50/ insulin to shift  Vasopressin increased to 0.05  Labs ordered     Intervention Category Major Interventions: Arrhythmia - evaluation and management  [name] 05/29/2019, 11:58 PM

## 2019-05-29 NOTE — Code Documentation (Addendum)
..  CODE BLUE NOTE  Patient Name: John Petty   MRN: [id number]   Date of Birth/ Sex: 08/22/1980 , male      Admission Date: 06/25/2019  Attending Provider: Candee Furbish, MD  Primary Diagnosis: <principal problem not specified>    Indication: Pt returned from IR for vascular cath placement at 2242 Code blue was subsequently called at 2246.  At the time of arrival on scene, ACLS protocol was underway by ICU RN staff. Being bagged with AMBU.  On a backboard with pacer pads.  Initial rhythm: Asystole   CODE called at 2246 ROSC at 2258 Total time 12 mins  Technical Description:  - CPR performance duration:  12 minutes  - Was defibrillation or cardioversion used? No   - Was external pacer placed? No  - Was patient intubated pre/post CPR? Yes    Medications Administered: Y = Yes; Blank = No Amiodarone    Atropine    Calcium     1  Epinephrine     3  Lidocaine    Magnesium    Norepinephrine    Phenylephrine    Sodium bicarbonate     3  Vasopressin     + 10 units of insulin and 1 amp of D50   Post CPR evaluation:  - Final Status - Was patient successfully resuscitated ? Yes - What is current rhythm? Sinus tach - What is current hemodynamic status? Started on Levo, Vaso and Epi gtt and given bicarb pushes  Miscellaneous Information:  - Labs sent, including: none  - Primary team notified?  Yes  - Family Notified? Yes  - Additional notes/ transfer status:  will restart CRRT now that pt has vascular access   Preesha Benjamin, Rise Paganini, MD  05/29/2019, 11:01 PM

## 2019-05-29 NOTE — Progress Notes (Signed)
eLink Physician-Brief Progress Note Patient Name: John Petty DOB: 11-Feb-1981 MRN: [id number]   Date of Service  05/29/2019  HPI/Events of Note  CPR ongoing and I was called in the room. 1 epineprhrine given. Patient just had femoral dialysis line placed.   eICU Interventions  Ordered calcium chloride and bicarb push. CCM team present at bedside and taking over code     Intervention Category Major Interventions: Code management / supervision  [name] 05/29/2019, 10:54 PM

## 2019-05-29 NOTE — ED Notes (Signed)
First unit of emergency PRBC given and verify by this RN and Louretta Parma, pt very confuse at this time unable to sign any consent, vital sign unstable emergency blood given per Dr. Colin Broach order.

## 2019-05-29 NOTE — Consult Note (Addendum)
[facility] Nurse wound consult note Pt is familiar to [facility] team from previous admission.  Reason for Consult: numerous full thickness wounds in multiple body locations:  Posterior neck, sacrum/buttocks, bilat axillary, subpannicular and bilateral groin and scrotum areas Wound type: inflammatory, immunosuppressive lesions, appearance is consistent with possible hidradenitis suppurativa and also probable candidias.  Right groin previously had a dialysis catheter which was accidentally pulled out prior to admission and was noted to have significant amt bleeding; currently it is a full thickness puncture wound; aprrox .2X.2cm, no further bleeding.  Dry gauze dressing in place and vascular team was in earlier to assess the location.  Numerous lesions in the aforementioned locations are red, moist, macerated and bleeding, mod amt tan drainage, painful to touch.  Large amt tan drainage. Some areas with scarring in previously healed locations Dressing procedure/placement/frequency: Instructions provided for staff nurses regarding topical care using antimicrobial textile (InterDry Ag+) while in house to absorb drainage and provide antimicrobial benefits, but this will not be curative. This complex medical condition is beyond the scope of practice for [facility] nursing; recommend referral to either Surgery, Plastic Surgery or Dermatology for ongoing care and oversight of the lesions.  Please re-consult if further assistance is needed.  Thank-you,  Julien Girt MSN, [place], [place], [place], [place]

## 2019-05-29 NOTE — Sedation Documentation (Signed)
SBAR called to [facility], Therapist, sports. Pt transported back to 2M05 with RN, RT on bed with monitor.

## 2019-05-29 NOTE — Procedures (Signed)
Interventional Radiology Procedure Note  Procedure: RT FEM TUNNELED HD CATH  Complications: None  Estimated Blood Loss: MIN  Findings: TIP IVC RA JUNCTION

## 2019-05-29 NOTE — Consult Note (Addendum)
Hospital Consult    Reason for Consult:  In need of HD access Requesting PhysicianSidney Ace MRN #:  [id number]   Information obtained from chart and nephrology as pt is lethargic/confused and cannot give any information.  History of Present Illness: This is a 39 y.o. male who presented to the hospital last evening after his right femoral catheter came out.  Apparently he had significant blood loss per the chart.   His TDC was placed by IR in October 2020.  NP states that he is up significantly in his weight and in need of HD.  He was supposed to get an extra HD treatment yesterday, but did not show up for this.  She states he has 14 failed accesses.   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 Trula Slade.  He has hx of right thigh graft.  When pt moved to [facility], per Dr. Stephens Shire note in March 2020, he had undergone BUE venogram and there was discussions on whether or not he should have a HeRO placed.  He also told Dr. Trula Slade that he was running out of options for upper extremity access.   His covid test was negative.  The pt is not on a statin for cholesterol management.  The pt is not on a daily aspirin.   Other AC:  none The pt is not on meds for hypertension.   The pt is not diabetic.   Tobacco hx:  never  Past Medical History:  Diagnosis Date  . Chronic kidney disease    esrd  . Complication of anesthesia    IV- site burned and itching- one time event  . History of blood transfusion   . Hypertension     Past Surgical History:  Procedure Laterality Date  . ARTERIOVENOUS GRAFT PLACEMENT    . AV FISTULA PLACEMENT    . AV FISTULA PLACEMENT Left 05/07/2018   Procedure: INSERTION OF ARTERIOVENOUS (AV) GORE-TEX GRAFT LEFT THIGH;  Surgeon: Serafina Mitchell, MD;  Location: [facility];  Service: Vascular;  Laterality: Left;  . AVF    . INSERTION OF DIALYSIS CATHETER    . IR FLUORO GUIDE CV LINE LEFT  12/25/2018  . THROMBECTOMY AND REVISION OF ARTERIOVENTOUS (AV)  GORETEX  GRAFT Left 05/15/2018   Procedure: THROMBECTOMY OF ARTERIOVENTOUS (AV) GORETEX  GRAFT LEFT THIGH;  Surgeon: Serafina Mitchell, MD;  Location: [facility];  Service: Vascular;  Laterality: Left;  . TONSILLECTOMY      No Known Allergies  Prior to Admission medications   Medication Sig Start Date End Date Taking? Authorizing Provider  oxyCODONE-acetaminophen (PERCOCET/ROXICET) 5-325 MG tablet Take 1 tablet by mouth every 6 (six) hours as needed. Patient not taking: Reported on 05/29/2019 05/15/18   Ulyses Amor, PA-C  sevelamer carbonate (RENVELA) 800 MG tablet Take 2,400-3,200 mg by mouth See admin instructions. Take 4 tablets (3200 mg) by mouth with each meal & take 3 tablets (2400 mg) by mouth with snacks    [provider]    Social History   Socioeconomic History  . Marital status: Single    Spouse name: Not on file  . Number of children: Not on file  . Years of education: Not on file  . Highest education level: Not on file  Occupational History  . Not on file  Tobacco Use  . Smoking status: Never Smoker  . Smokeless tobacco: Never Used  Substance and Sexual Activity  . Alcohol use: Yes  Comment: maybe 1 mixed drink 2 times week  . Drug use: Not Currently  . Sexual activity: Not on file  Other Topics Concern  . Not on file  Social History Narrative  . Not on file   Social Determinants of Health   Financial Resource Strain:   . Difficulty of Paying Living Expenses:   Food Insecurity:   . Worried About Charity fundraiser in the Last Year:   . Arboriculturist in the Last Year:   Transportation Needs:   . Film/video editor (Medical):   Marland Kitchen Lack of Transportation (Non-Medical):   Physical Activity:   . Days of Exercise per Week:   . Minutes of Exercise per Session:   Stress:   . Feeling of Stress :   Social Connections:   . Frequency of Communication with Friends and Family:   . Frequency of Social Gatherings with Friends and Family:   . Attends  Religious Services:   . Active Member of Clubs or Organizations:   . Attends Archivist Meetings:   Marland Kitchen Marital Status:   Intimate Partner Violence:   . Fear of Current or Ex-Partner:   . Emotionally Abused:   Marland Kitchen Physically Abused:   . Sexually Abused:      Family History  Problem Relation Age of Onset  . Heart disease Mother   . CVA Mother   . Heart disease Father   . CVA Father   . Kidney disease Father     ROS: [x]  Positive   [ ]  Negative   [ ]  All sytems reviewed and are negative Unable to obtain    Physical Examination  Vitals:   05/29/19 0456 05/29/19 0531  BP:  (!) 107/54  Pulse:  (!) 115  Resp: (!) 22 20  Temp:  98.1 F (36.7 C)  SpO2:  93%   Body mass index is 46.2 kg/m.  General:  WD obese male in 4 point restraints and agitated  Gait: Not observed HENT: WNL, normocephalic Pulmonary: normal non-labored breathing Cardiac: regular Abdomen: obese, soft, NT/ND, no masses Skin: significant yeast from side to side under panus; skin is cool and clammy  Vascular Exam/Pulses: Unable to palpate pedal pulses Extremities: without ischemic changes, without Gangrene , without cellulitis; without open wounds; there is purulent drainage from the right TDC tunneled site. Musculoskeletal: no muscle wasting or atrophy  Neurologic: lethargic and not following commands.   CBC    Component Value Date/Time   WBC 17.8 (H) 05/29/2019 0752   RBC 3.39 (L) 05/29/2019 0752   HGB 9.7 (L) 05/29/2019 0752   HCT 31.5 (L) 05/29/2019 0752   PLT 173 05/29/2019 0752   MCV 92.9 05/29/2019 0752   MCH 28.6 05/29/2019 0752   MCHC 30.8 05/29/2019 0752   RDW 19.7 (H) 05/29/2019 0752   LYMPHSABS 0.4 (L) 06/16/2019 2334   MONOABS 0.7 06/08/2019 2334   EOSABS 0.0 06/16/2019 2334   BASOSABS 0.1 06/17/2019 2334    BMET    Component Value Date/Time   NA 133 (L) 06/10/2019 2343   K 5.9 (H) 06/01/2019 2343   CL 102 06/17/2019 2343   CO2 13 (L) 06/21/2019 2334   GLUCOSE 172  (H) 06/01/2019 2343   BUN 60 (H) 06/18/2019 2343   CREATININE 13.10 (H) 06/18/2019 2343   CALCIUM 8.4 (L) 06/25/2019 2334   GFRNONAA 4 (L) 06/18/2019 2334   GFRAA 5 (L) 05/31/2019 2334    COAGS: Lab Results  Component Value Date  INR 1.8 (H) 06/05/2019     Non-Invasive Vascular Imaging:   BUE vein mapping 04/28/2018: +-----------------+-------------+----------+--------+  Right Cephalic  Diameter (cm)Depth (cm)Findings  +-----------------+-------------+----------+--------+  Shoulder       0.35              +-----------------+-------------+----------+--------+  Prox upper arm    0.35              +-----------------+-------------+----------+--------+  Mid upper arm    0.27              +-----------------+-------------+----------+--------+  Dist upper arm   0.27 / .31            +-----------------+-------------+----------+--------+  Antecubital fossa  0.26              +-----------------+-------------+----------+--------+  Prox forearm     0.24              +-----------------+-------------+----------+--------+  Mid forearm     0.19              +-----------------+-------------+----------+--------+  Dist forearm     0.24              +-----------------+-------------+----------+--------+   +-----------------+-------------+----------+-------------------+  Right Basilic  Diameter (cm)Depth (cm)   Findings     +-----------------+-------------+----------+-------------------+  Prox upper arm                  graft      +-----------------+-------------+----------+-------------------+  Mid upper arm    0.49        graft and branching  +-----------------+-------------+----------+-------------------+  Dist upper arm    0.32        graft and branching    +-----------------+-------------+----------+-------------------+  Antecubital fossa  0.20                    +-----------------+-------------+----------+-------------------+  Prox forearm                  too small     +-----------------+-------------+----------+-------------------+   +-----------------+-------------+----------+--------------+  Left Cephalic  Diameter (cm)Depth (cm)  Findings    +-----------------+-------------+----------+--------------+  Prox upper arm                thrombus    +-----------------+-------------+----------+--------------+  Mid upper arm                thrombus    +-----------------+-------------+----------+--------------+  Dist upper arm                thrombus    +-----------------+-------------+----------+--------------+  Antecubital fossa            not visualized  +-----------------+-------------+----------+--------------+  Prox forearm     0.13                 +-----------------+-------------+----------+--------------+  Mid forearm    0.12 / .21               +-----------------+-------------+----------+--------------+  Dist forearm     0.20                 +-----------------+-------------+----------+--------------+   +-----------------+-------------+----------+--------------------+  Left Basilic   Diameter (cm)Depth (cm)   Findings     +-----------------+-------------+----------+--------------------+  Prox upper arm                not visualized    +-----------------+-------------+----------+--------------------+  Mid upper arm    0.40        mis to distal. joins  +-----------------+-------------+----------+--------------------+  Dist upper arm    0.41                     +-----------------+-------------+----------+--------------------+  Antecubital fossa  0.35                    +-----------------+-------------+----------+--------------------+  Prox forearm     0.26                    +-----------------+-------------+----------+--------------------+   Summary: Right: Measurements above.  Left: Measurements above. Superficial thrombophlebitis / non    compressability in the left cephalic vein proximal arm    extending to distal upper arm, age undetermined.    ASSESSMENT/PLAN: This is a 39 y.o. male with ESRD who's right femoral TDC pulled out last evening.    -there is purulent drainage from the right femoral tunneled site as well as possible yeast all throughout underneath his panus with leukocytosis.  Needs cx.  Would not put in [facility] at this time due to purulent drainage.  Would need temp cath, which may be able to be placed where left IJ catheter is now.   -will follow peripherally; given BUE vein mapping is over a year old, when pt is more stable, may be able to repeat BUE vein mapping to see if he has any upper extremity options.   If not, may need trans lumbar catheter.    Leontine Locket, PA-C [facility] [phone]  I have examined the patient, reviewed and agree with above.  Continued clinical deterioration.  Now in the intensive care unit.  Intubated.  GI bleed.  Has his left IJ catheter exchanged for a temporary dialysis catheter.  As noted this does appear to be in a intercostal vein or some other vein not directed towards the right atrium.  Hopefully this will provide emergent dialysis access.  He is running out of options with known occlusion of the right innominate vein.  Not an option for femoral catheter due to fungal infection.  May be a candidate for translumbar catheter if he stabilizes  Curt Jews, MD 05/29/2019 3:37 PM

## 2019-05-29 NOTE — Progress Notes (Addendum)
eLink Physician-Brief Progress Note Patient Name: John Petty DOB: 1980/07/18 MRN: [id number]   Date of Service  05/29/2019  HPI/Events of Note  Notified of dialysis catheter not working properly after working for a few hours. Able to flush but unable to draw back.  CXR reviewed and left IJ appears to be proper position but may be in a branch vein.  eICU Interventions  Will inform bedside md  Informed by bedside team that it was a difficult line and to call IR     Intervention Category Minor Interventions: Other:  [name] 05/29/2019, 7:28 PM

## 2019-05-29 NOTE — Procedures (Signed)
Intubation Procedure Note Jeanpierre Thebeau [id number] Mar 22, 1980  Procedure: Intubation Indications: Respiratory insufficiency  Procedure Details Consent: Unable to obtain consent because of emergent medical necessity. Time Out: Verified patient identification, verified procedure, site/side was marked, verified correct patient position, special equipment/implants available, medications/allergies/relevent history reviewed, required imaging and test results available.  Performed  4 Medications:  Fentanyl  Etomidate 20 mg Versed NMB rocuronium 50 mg    Evaluation Hemodynamic Status: BP stable throughout; O2 sats: stable throughout Patient's Current Condition: stable Complications: No apparent complications Patient did tolerate procedure well. Chest X-ray ordered to verify placement.  CXR: pending.   Richardson Landry Oktober Glazer ACNP Maryanna Shape PCCM Pager [phone] till 3 pm If no answer page [phone] 05/29/2019, 11:06 AM

## 2019-05-29 NOTE — H&P (Addendum)
at [facility] NAME: John Petty    MR#:  [id number]  DATE OF BIRTH:  Apr 17, 1980  DATE OF ADMISSION:  06/12/2019  PRIMARY CARE PHYSICIAN: Rikki Spearing, NP   REQUESTING/REFERRING PHYSICIAN: Montine Circle, PA-C  CHIEF COMPLAINT:   Chief Complaint  Patient presents with  . Vascular Access Problem    HISTORY OF PRESENT ILLNESS:  John Petty  is a 39 y.o. obese African-American male with a known history of end-stage renal disease on hemodialysis and hypertension, who presented to the emergency room with acute onset of bleeding from his right groin.  He was walking in his dialysis catheter located in his right groin got caught on handrail and was torn out off his leg.  EMS reported 1 L of blood surrounding the patient on arrival.  Bleeding was controlled with compression.  Patient felt cold and tired.  He was fairly confused and found to be hypoglycemic with a blood glucose of 20.  He denies any fever or chills, nausea or vomiting or abdominal pain.  He denies any chest pain or dyspnea or cough or wheezing.  No other bleeding diathesis.  Upon presentation to the emergency room, blood pressure was 100/59 with a pulse of 113 and otherwise normal vital signs.  He later became hypotensive with pressure of 55/29.  That later improved to 111/53.  Labs revealed potassium 5.9, glucose of 184, BUN 70 and creatinine 12.67 with significant cytosis of 25.2 neutrophilia and hemoglobin of 8 with a hematocrit of 26.6 compared to 12.6 and 37 on 05/15/2018 and neutrophilia.  Influenza antigens and COVID-19 PCR came back negative.  The patient was typed and crossmatched and ordered 2 units of packed red blood cells.  A CVP was placed by Dr. Basilia Jumbo in the ER.  Patient was given IM glucagon and 1 amp of D50.  He will be admitted to a medical telemetry bed for further evaluation and management. PAST MEDICAL HISTORY:   Past Medical History:  Diagnosis Date  . Chronic kidney  disease    esrd  . Complication of anesthesia    IV- site burned and itching- one time event  . History of blood transfusion   . Hypertension     PAST SURGICAL HISTORY:   Past Surgical History:  Procedure Laterality Date  . ARTERIOVENOUS GRAFT PLACEMENT    . AV FISTULA PLACEMENT    . AV FISTULA PLACEMENT Left 05/07/2018   Procedure: INSERTION OF ARTERIOVENOUS (AV) GORE-TEX GRAFT LEFT THIGH;  Surgeon: Serafina Mitchell, MD;  Location: [facility];  Service: Vascular;  Laterality: Left;  . AVF    . INSERTION OF DIALYSIS CATHETER    . IR FLUORO GUIDE CV LINE LEFT  12/25/2018  . THROMBECTOMY AND REVISION OF ARTERIOVENTOUS (AV) GORETEX  GRAFT Left 05/15/2018   Procedure: THROMBECTOMY OF ARTERIOVENTOUS (AV) GORETEX  GRAFT LEFT THIGH;  Surgeon: Serafina Mitchell, MD;  Location: [facility];  Service: Vascular;  Laterality: Left;  . TONSILLECTOMY      SOCIAL HISTORY:   Social History   Tobacco Use  . Smoking status: Never Smoker  . Smokeless tobacco: Never Used  Substance Use Topics  . Alcohol use: Yes    Comment: maybe 1 mixed drink 2 times week    FAMILY HISTORY:   Family History  Problem Relation Age of Onset  . Heart disease Mother   . CVA Mother   . Heart disease Father   . CVA Father   .  Kidney disease Father     DRUG ALLERGIES:  No Known Allergies  REVIEW OF SYSTEMS:   ROS As per history of present illness. All pertinent systems were reviewed above. Constitutional,  HEENT, cardiovascular, respiratory, GI, GU, musculoskeletal, neuro, psychiatric, endocrine,  integumentary and hematologic systems were reviewed and are otherwise  negative/unremarkable except for positive findings mentioned above in the HPI.   MEDICATIONS AT HOME:   Prior to Admission medications   Medication Sig Start Date End Date Taking? Authorizing Provider  oxyCODONE-acetaminophen (PERCOCET/ROXICET) 5-325 MG tablet Take 1 tablet by mouth every 6 (six) hours as needed. Patient not taking: Reported on  05/29/2019 05/15/18   Ulyses Amor, PA-C  sevelamer carbonate (RENVELA) 800 MG tablet Take 2,400-3,200 mg by mouth See admin instructions. Take 4 tablets (3200 mg) by mouth with each meal & take 3 tablets (2400 mg) by mouth with snacks    [provider]      VITAL SIGNS:  Blood pressure 127/78, pulse (!) 28, temperature (!) 97 F (36.1 C), resp. rate (!) 29, height 5\' 4"  (1.626 m), weight 122.1 kg, SpO2 96 %.  PHYSICAL EXAMINATION:  Physical Exam  GENERAL:  39 y.o.-year-old obese African-American male patient lying in the bed with no acute distress.  He was alert and oriented to place and to his name only. EYES: Pupils equal, round, reactive to light and accommodation. No scleral icterus. Extraocular muscles intact.  HEENT: Head atraumatic, normocephalic. Oropharynx and nasopharynx clear.  NECK:  Supple, no jugular venous distention. No thyroid enlargement, no tenderness.  LUNGS: Normal breath sounds bilaterally, no wheezing, rales,rhonchi or crepitation. No use of accessory muscles of respiration.  CARDIOVASCULAR: Regular rate and rhythm, S1, S2 normal. No murmurs, rubs, or gallops.  ABDOMEN: Soft, nondistended, nontender. Bowel sounds present. No organomegaly or mass.  EXTREMITIES: No pedal edema, cyanosis, or clubbing.  He has residual bloodstains over his right lower extremity. NEUROLOGIC: Cranial nerves II through XII are intact. Muscle strength 5/5 in all extremities. Sensation intact. Gait not checked.  PSYCHIATRIC: The patient is alert and oriented x 2 to place into his name at the time.  Apathetic affect and poor eye contact. SKIN: No obvious rash, lesion, or ulcer.   LABORATORY PANEL:   CBC Recent Labs  Lab 06/12/2019 2334 06/02/2019 2334 06/23/2019 2343  WBC 25.2*  --   --   HGB 8.0*   < > 9.5*  HCT 26.6*   < > 28.0*  PLT 200  --   --    < > = values in this interval not displayed.    ------------------------------------------------------------------------------------------------------------------  Chemistries  Recent Labs  Lab 06/08/2019 2334 06/11/2019 2334 06/24/2019 2343  NA 135   < > 133*  K 5.9*   < > 5.9*  CL 95*   < > 102  CO2 13*  --   --   GLUCOSE 184*   < > 172*  BUN 70*   < > 60*  CREATININE 12.67*   < > 13.10*  CALCIUM 8.4*  --   --    < > = values in this interval not displayed.   ------------------------------------------------------------------------------------------------------------------  Cardiac Enzymes No results for input(s): TROPONINI in the last 168 hours. ------------------------------------------------------------------------------------------------------------------  RADIOLOGY:  CT Head Wo Contrast  Result Date: 05/29/2019 CLINICAL DATA:  Speech difficulty EXAM: CT HEAD WITHOUT CONTRAST TECHNIQUE: Contiguous axial images were obtained from the base of the skull through the vertex without intravenous contrast. COMPARISON:  None. FINDINGS: Brain: No evidence of acute  infarction, hemorrhage, hydrocephalus, extra-axial collection or mass lesion/mass effect. Global cortical atrophy. Vascular: No hyperdense vessel or unexpected calcification. Skull: Normal. Negative for fracture or focal lesion. 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 Sinuses/Orbits: The visualized paranasal sinuses are essentially clear. The mastoid air cells are unopacified. Other: None. IMPRESSION: No evidence of acute intracranial abnormality. Global cortical atrophy. Electronically Signed   By: Julian Hy M.D.   On: 05/29/2019 00:58   DG Chest Portable 1 View  Result Date: 06/05/2019 CLINICAL DATA:  Central line placement EXAM: PORTABLE CHEST 1 VIEW COMPARISON:  None. FINDINGS: Lungs are clear.  No pleural effusion or pneumothorax. The heart is normal in size.  Right SVC stent. Left IJ venous catheter courses along the left mediastinum and likely terminates within a left  intercostal vein. IMPRESSION: Left IJ venous catheter courses along the left mediastinum and likely terminates within the left intracostal vein. Right SVC stent. Electronically Signed   By: Julian Hy M.D.   On: 06/21/2019 23:48      IMPRESSION AND PLAN:   1.  Acute blood loss anemia due to damage right groin hemodialysis vascular access. -The patient will be admitted to a telemetry medical bed. -We will follow serial hemoglobin hematocrits. -Patient was typed and crossmatch and is being transfused 2 units of packed red blood cells. -We will follow posttransfusion H&H. -We will obtain vascular surgery consultation to establish another access for hemodialysis.  A consult was placed to Dr. Carlis Abbott and he can be called in the morning.  2.  Hypoglycemia with acute hypoglycemic metabolic encephalopathy. -The patient was given an amp of D50.  We will continue monitoring his blood glucose measures.  2.  End-stage renal disease on hemodialysis. -Nephrology consultation will be obtained this a.m. -I placed a consult for Dr. Augustin Coupe.  He can be called in the morning. -We will continue his Renvela.  3.  Hyperkalemia. -The patient was ordered p.o. Kayexalate.  4.  DVT prophylaxis. -SCDs for now. -Medical prophylaxis currently being held off given acute blood loss anemia.  All the records are reviewed and case discussed with ED provider. The plan of care was discussed in details with the patient (and family). I answered all questions. The patient agreed to proceed with the above mentioned plan. Further management will depend upon hospital course.   CODE STATUS: Full code.  The patient is admitted to inpatient status due to the intensity of medical problems, potential risks involved and management requirement that will necessitate more than 2 midnights.  TOTAL TIME TAKING CARE OF THIS PATIENT:50 minutes.    Christel Mormon M.D on 05/29/2019 at 3:20 AM  [facility]   From 7 PM-7 AM,  contact night-coverage [email or web address]  CC: Primary care physician; Rikki Spearing, NP   Note: This dictation was prepared with Dragon dictation along with smaller phrase technology. Any transcriptional errors that result from this process are unintentional.

## 2019-05-29 NOTE — Progress Notes (Signed)
Patient seen and examined personally, I reviewed the chart, history and physical and admission note, done by admitting physician this morning.Please refer to the morning admission note for more detailed plan of care.  Briefly, 20yom w/ morbid obesity bmi 46, ESRD on HD, hypertension presented to the ER with bleeding from the site of dialysis catheter on the right groin.  He was walking in his dialysis catheter located in his right groin got caught on handrail and was torn out off his leg.  EMS reported 1 L of blood surrounding the patient on arrival.  Bleeding was controlled with compression.  Patient felt cold and tired.  He was fairly confused and found to be hypoglycemic with a blood glucose of 20. In the ER lab with hyperkalemia K5.9, BUN 70 creatinine 2.6, status is 25.2.  COVID-19 negative. Head CT head no acute finding, CVP was placed in the ER, chest x-ray showed ' Left IJ venous catheter courses along the left mediastinum and likely terminates within the left intracostal vein. Right SVC stent". 2 units PRBC transfused admitted.  Issues: Bleeding from rt goin afte HD catheter got pulled out: catheter apparently got removed while walking.S/P 2 unit PRBCs was transfused emergently in the ER,repeat H/H-improved.No more bleeding at groin.  ESRD on HD TTS,missed Thursday.BMP w 7 BUN and creatinine, nephro was notified this morning came and saw the patient at bedside planning for hemodialysis versus CRRT in ICU   Hyperkalemia- kayexalate was ordered- but not taken.Difficulty stick- Spoke w/ Rn to get labs asap.his baseline k is 6-8 per his nephro team and usually on 1k bath for his HD.  Hypoglycemia s/p treatment w/ dextrose/glucagon- sugar intermittently low-check q2h cbg and replete, glucan gon given again.  Acute Metabolic Encephalopathy- CT head neg for acute findings.Felt to be from hypoglycemia/uremia, and possible sepsis.Pt is on restraints overnight this morning actively moving his  extremities but later stopped moving as he HD hypoglycemia and hypotension.Minimally responsive- being wheleed to ICU- can check abg vs intubation.  Hypotension, chronic- supposed to be on midodrine.  Likely lower now-with suspected sepsis  Possible Sepsis-he has extensive Skin excoriations in groins/pannus- pus coming out form rt groin- send for culture.  Ordered blood culture x2, discussed with infectious Dr. Megan Salon given concern for fungal infection will place on Diflucan, vanco/zosyn- pharmacy to dose.  Central line terminates in left intercostal vein- IR consulted to replace.  Morbid obesity with BMI 46  patient had episode of further hypoglycemia 39 and became more unresponsive, although was spontaneously breathing, has cold periphery unable to check spo2. Came and saw him emergently at bedside.Got glucangon/dextrose- sugar came up in 90s. Spoke with ICU and being wheeled to ICU, Dr Smith/S Minor from [facility] team at bedside. Patient prognosis remains to be seen.Attempted to contact the number provided by nephrology for his next of kin at [phone] answer. Patient is being transferred to critical care unit, remains critically ill.Marland Kitchen

## 2019-05-29 NOTE — Progress Notes (Signed)
CXR reviewed CVP tracing good and c/w venous placement  Good pull and push of dark red blood Okay to use for CRRT for this particular clinical circumstance

## 2019-05-29 NOTE — Progress Notes (Signed)
Patient was received with 4 point restraints for loss of right femoral dialysis cath with pressure dressing noted. No active bleeding noted. Patient's K 6.2 and was placed on cardiac monitor on NSR . On 2L per nasal cannula. Consulted Wound Nurse for patient skin folds with bleedings MASD.  IV team was consulted for blood draw and was seen later by Nephro.

## 2019-05-29 NOTE — Progress Notes (Signed)
Pt was brought to the unit by rapid response. Prior sugar was in 30, corrected with D50. BP is low. MD at the bedside. Pt was intubated.  Emergent HD(3 lumen) was placed in IJ, by Dr. Tamala Julian. . Dr. Tamala Julian reviewed Xray, states that it showed on xray suspiciously, we can use it, a CVP is displaying. Aline was placed by Richardson Landry Minor. Both lines zeroes and in use.  Sugar recheck in 60s, 30s, both D50 given and corrected at this time. Skin is very fragile. MASD is under every fold(Moderately obese), with open skin, some bleeding is noted as well. OG placed. 500 ml dark liquid is out immediately on low suction. MD is aware. At this time, the color is more red. Heparin held. Levo, Fentanyland ABX are infusing at this time. CRRT machine is prepared. Waiting on 2/2.5 bag. Will start shortly.

## 2019-05-29 NOTE — Progress Notes (Signed)
  Echocardiogram 2D Echocardiogram has been performed.  John Petty 05/29/2019, 5:46 PM

## 2019-05-29 NOTE — Progress Notes (Signed)
Spoke with patient's friend, Erlene Quan, and informed him about his friend's condition. Discussed with him that if he is not the person in Mr. Dorsch life who should be making decisions that we should get in touch with that person. Erlene Quan states he will try to contact the patient's siblings.   Later received call from patient's sister, Peter Congo, and made her aware of plans to have patient get another HD catheter access so that we can treat him with CRRT and explained that he is on a ventilator/ pressors. Further informed her that we have visitation starting at 7 am in the morning and she is welcome to come and speak with doctors. She was agreeable to this, but does state she lives in [place].   Milford Cage, RN

## 2019-05-29 NOTE — Progress Notes (Signed)
CRITICAL VALUE ALERT  Critical Value:  Lactic Acid 3.4/Potassium 6.4  Date & Time Notied:  05/29/2019 at 1615  Provider Notified: Dr. Tamala Julian  Orders Received/Actions taken: No new orders given

## 2019-05-29 NOTE — Progress Notes (Signed)
Chaplain responded to Code Blue.  Chaplain is available to provide support to John Petty's family when they arrive.

## 2019-05-29 NOTE — Progress Notes (Signed)
Hypoglycemic Event  CBG: 65mg /dL @ 06:01  Treatment: given D50 1amp 67ml bolus thru IV  Symptoms: asymptomatic  Follow-up CBG: 84 mg/dL      Time: 06:39     Possible Reasons for Event: unknown  Comments/MD notified: Bodenheimer, NP  Babs Sciara

## 2019-05-29 NOTE — Progress Notes (Signed)
Per Dr. Tamala Julian, RN is not to start tube feed or administer any medications through OG tube due to dark red output.

## 2019-05-29 NOTE — Progress Notes (Signed)
Upon rounding on patient this morning, CXR checked and central line noted to be in left intercostal vein (not central placement). Dr. Lupita Leash and primary RN made aware and new orders received.

## 2019-05-29 NOTE — Progress Notes (Signed)
PCCM progress:  Patient's sister Peter Congo notified of patient's code blue and critically ill status and she will come to the hospital tonight.   Otilio Carpen Nancy Manuele, PA-C

## 2019-05-30 LAB — POCT I-STAT 7, (LYTES, BLD GAS, ICA,H+H)
Acid-base deficit: 9 mmol/L — ABNORMAL HIGH (ref 0.0–2.0)
Acid-base deficit: 11 mmol/L — ABNORMAL HIGH (ref 0.0–2.0)
Acid-base deficit: 11 mmol/L — ABNORMAL HIGH (ref 0.0–2.0)
Bicarbonate: 19.1 mmol/L — ABNORMAL LOW (ref 20.0–28.0)
Bicarbonate: 16.2 mmol/L — ABNORMAL LOW (ref 20.0–28.0)
Bicarbonate: 16.6 mmol/L — ABNORMAL LOW (ref 20.0–28.0)
Calcium, Ion: 1.11 mmol/L — ABNORMAL LOW (ref 1.15–1.40)
Calcium, Ion: 1.11 mmol/L — ABNORMAL LOW (ref 1.15–1.40)
Calcium, Ion: 1.09 mmol/L — ABNORMAL LOW (ref 1.15–1.40)
HCT: 29 % — ABNORMAL LOW (ref 39.0–52.0)
HCT: 30 % — ABNORMAL LOW (ref 39.0–52.0)
HCT: 28 % — ABNORMAL LOW (ref 39.0–52.0)
Hemoglobin: 9.9 g/dL — ABNORMAL LOW (ref 13.0–17.0)
Hemoglobin: 10.2 g/dL — ABNORMAL LOW (ref 13.0–17.0)
Hemoglobin: 9.5 g/dL — ABNORMAL LOW (ref 13.0–17.0)
O2 Saturation: 93 %
O2 Saturation: 94 %
O2 Saturation: 100 %
Patient temperature: 98.6
Patient temperature: 98
Patient temperature: 97.6
Potassium: 5.9 mmol/L — ABNORMAL HIGH (ref 3.5–5.1)
Potassium: 6 mmol/L — ABNORMAL HIGH (ref 3.5–5.1)
Potassium: 5.3 mmol/L — ABNORMAL HIGH (ref 3.5–5.1)
Sodium: 139 mmol/L (ref 135–145)
Sodium: 137 mmol/L (ref 135–145)
Sodium: 137 mmol/L (ref 135–145)
TCO2: 21 mmol/L — ABNORMAL LOW (ref 22–32)
TCO2: 17 mmol/L — ABNORMAL LOW (ref 22–32)
TCO2: 18 mmol/L — ABNORMAL LOW (ref 22–32)
pCO2 arterial: 48.7 mmHg — ABNORMAL HIGH (ref 32.0–48.0)
pCO2 arterial: 42.1 mmHg (ref 32.0–48.0)
pCO2 arterial: 43.8 mmHg (ref 32.0–48.0)
pH, Arterial: 7.201 — ABNORMAL LOW (ref 7.350–7.450)
pH, Arterial: 7.191 — CL (ref 7.350–7.450)
pH, Arterial: 7.183 — CL (ref 7.350–7.450)
pO2, Arterial: 83 mmHg (ref 83.0–108.0)
pO2, Arterial: 87 mmHg (ref 83.0–108.0)
pO2, Arterial: 341 mmHg — ABNORMAL HIGH (ref 83.0–108.0)

## 2019-05-30 LAB — CBC
HCT: 25.2 % — ABNORMAL LOW (ref 39.0–52.0)
HCT: 30.7 % — ABNORMAL LOW (ref 39.0–52.0)
Hemoglobin: 7.8 g/dL — ABNORMAL LOW (ref 13.0–17.0)
Hemoglobin: 9.3 g/dL — ABNORMAL LOW (ref 13.0–17.0)
MCH: 28.9 pg (ref 26.0–34.0)
MCH: 28.6 pg (ref 26.0–34.0)
MCHC: 31 g/dL (ref 30.0–36.0)
MCHC: 30.3 g/dL (ref 30.0–36.0)
MCV: 93.3 fL (ref 80.0–100.0)
MCV: 94.5 fL (ref 80.0–100.0)
Platelets: 82 10*3/uL — ABNORMAL LOW (ref 150–400)
Platelets: 77 10*3/uL — ABNORMAL LOW (ref 150–400)
RBC: 2.7 MIL/uL — ABNORMAL LOW (ref 4.22–5.81)
RBC: 3.25 MIL/uL — ABNORMAL LOW (ref 4.22–5.81)
RDW: 20 % — ABNORMAL HIGH (ref 11.5–15.5)
RDW: 20.2 % — ABNORMAL HIGH (ref 11.5–15.5)
WBC: 16.4 10*3/uL — ABNORMAL HIGH (ref 4.0–10.5)
WBC: 24.2 10*3/uL — ABNORMAL HIGH (ref 4.0–10.5)
nRBC: 0.7 % — ABNORMAL HIGH (ref 0.0–0.2)
nRBC: 0.8 % — ABNORMAL HIGH (ref 0.0–0.2)

## 2019-05-30 LAB — TYPE AND SCREEN
ABO/RH(D): B POS
Antibody Screen: NEGATIVE
Unit division: 0
Unit division: 0
Unit division: 0

## 2019-05-30 LAB — BPAM RBC
Blood Product Expiration Date: 202105012359
Blood Product Expiration Date: 202105042359
Blood Product Expiration Date: 202105042359
ISSUE DATE / TIME: 202104012303
ISSUE DATE / TIME: 202104020124
ISSUE DATE / TIME: 202104020726
Unit Type and Rh: 5100
Unit Type and Rh: 5100
Unit Type and Rh: 7300

## 2019-05-30 LAB — RENAL FUNCTION PANEL
Albumin: 1.7 g/dL — ABNORMAL LOW (ref 3.5–5.0)
Anion gap: 28 — ABNORMAL HIGH (ref 5–15)
BUN: 55 mg/dL — ABNORMAL HIGH (ref 6–20)
CO2: 14 mmol/L — ABNORMAL LOW (ref 22–32)
Calcium: 8.8 mg/dL — ABNORMAL LOW (ref 8.9–10.3)
Chloride: 98 mmol/L (ref 98–111)
Creatinine, Ser: 8.34 mg/dL — ABNORMAL HIGH (ref 0.61–1.24)
GFR calc Af Amer: 8 mL/min — ABNORMAL LOW (ref >60)
GFR calc non Af Amer: 7 mL/min — ABNORMAL LOW (ref >60)
Glucose, Bld: 102 mg/dL — ABNORMAL HIGH (ref 70–99)
Phosphorus: 8.4 mg/dL — ABNORMAL HIGH (ref 2.5–4.6)
Potassium: 6.2 mmol/L — ABNORMAL HIGH (ref 3.5–5.1)
Sodium: 140 mmol/L (ref 135–145)

## 2019-05-30 LAB — BASIC METABOLIC PANEL
Anion gap: 25 — ABNORMAL HIGH (ref 5–15)
Anion gap: 33 — ABNORMAL HIGH (ref 5–15)
BUN: 70 mg/dL — ABNORMAL HIGH (ref 6–20)
BUN: 39 mg/dL — ABNORMAL HIGH (ref 6–20)
CO2: 19 mmol/L — ABNORMAL LOW (ref 22–32)
CO2: 16 mmol/L — ABNORMAL LOW (ref 22–32)
Calcium: 10.6 mg/dL — ABNORMAL HIGH (ref 8.9–10.3)
Calcium: 8.4 mg/dL — ABNORMAL LOW (ref 8.9–10.3)
Chloride: 100 mmol/L (ref 98–111)
Chloride: 95 mmol/L — ABNORMAL LOW (ref 98–111)
Creatinine, Ser: 10.8 mg/dL — ABNORMAL HIGH (ref 0.61–1.24)
Creatinine, Ser: 5.79 mg/dL — ABNORMAL HIGH (ref 0.61–1.24)
GFR calc Af Amer: 6 mL/min — ABNORMAL LOW (ref >60)
GFR calc Af Amer: 13 mL/min — ABNORMAL LOW (ref >60)
GFR calc non Af Amer: 5 mL/min — ABNORMAL LOW (ref >60)
GFR calc non Af Amer: 11 mL/min — ABNORMAL LOW (ref >60)
Glucose, Bld: 194 mg/dL — ABNORMAL HIGH (ref 70–99)
Glucose, Bld: 96 mg/dL (ref 70–99)
Potassium: 7.1 mmol/L (ref 3.5–5.1)
Potassium: 6.4 mmol/L (ref 3.5–5.1)
Sodium: 144 mmol/L (ref 135–145)
Sodium: 144 mmol/L (ref 135–145)

## 2019-05-30 LAB — FIBRINOGEN: Fibrinogen: 722 mg/dL — ABNORMAL HIGH (ref 210–475)

## 2019-05-30 LAB — PROTIME-INR
INR: 1.6 — ABNORMAL HIGH (ref 0.8–1.2)
Prothrombin Time: 18.9 seconds — ABNORMAL HIGH (ref 11.4–15.2)

## 2019-05-30 LAB — LACTIC ACID, PLASMA
Lactic Acid, Venous: >11 mmol/L (ref 0.5–1.9)
Lactic Acid, Venous: >11 mmol/L (ref 0.5–1.9)

## 2019-05-30 LAB — POTASSIUM: Potassium: 6.3 mmol/L (ref 3.5–5.1)

## 2019-05-30 LAB — GLUCOSE, CAPILLARY
Glucose-Capillary: 105 mg/dL — ABNORMAL HIGH (ref 70–99)
Glucose-Capillary: 143 mg/dL — ABNORMAL HIGH (ref 70–99)
Glucose-Capillary: 205 mg/dL — ABNORMAL HIGH (ref 70–99)
Glucose-Capillary: 61 mg/dL — ABNORMAL LOW (ref 70–99)
Glucose-Capillary: <10 mg/dL — CL (ref 70–99)

## 2019-05-30 LAB — MAGNESIUM: Magnesium: 2.5 mg/dL — ABNORMAL HIGH (ref 1.7–2.4)

## 2019-05-30 MED ORDER — DEXTROSE 50 % IV SOLN
INTRAVENOUS | Status: AC
  Administered 2019-05-30: 08:00:00 50 mL
  Filled 2019-05-30: qty 100

## 2019-05-30 MED ORDER — STERILE WATER FOR INJECTION IV SOLN
INTRAVENOUS | Status: DC
  Filled 2019-05-30: qty 150

## 2019-05-30 MED ORDER — PRISMASOL BGK 0/2.5 32-2.5 MEQ/L IV SOLN
INTRAVENOUS | Status: DC
  Filled 2019-05-30 (×17): qty 5000

## 2019-05-30 MED ORDER — PRISMASOL BGK 0/2.5 32-2.5 MEQ/L REPLACEMENT SOLN
Status: DC
  Filled 2019-05-30 (×2): qty 5000

## 2019-05-30 MED ORDER — ACETAMINOPHEN 650 MG RE SUPP: 650.0000 mg | Freq: Four times a day (QID) | RECTAL | Status: DC | PRN

## 2019-05-30 MED ORDER — MORPHINE BOLUS VIA INFUSION
5.0000 mg | INTRAVENOUS | Status: DC | PRN
  Filled 2019-05-30: qty 5

## 2019-05-30 MED ORDER — GLYCOPYRROLATE 1 MG PO TABS: 1.0000 mg | ORAL_TABLET | ORAL | Status: DC | PRN

## 2019-05-30 MED ORDER — INSULIN ASPART 100 UNIT/ML IV SOLN
10.0000 [IU] | Freq: Once | INTRAVENOUS | Status: AC
  Administered 2019-05-30: 01:00:00 10 [IU] via INTRAVENOUS

## 2019-05-30 MED ORDER — SODIUM BICARBONATE 8.4 % IV SOLN
INTRAVENOUS | Status: AC
  Filled 2019-05-30: qty 50

## 2019-05-30 MED ORDER — MORPHINE 100MG IN NS 100ML (1MG/ML) PREMIX INFUSION: 0.0000 mg/h | INTRAVENOUS | Status: DC

## 2019-05-30 MED ORDER — POLYVINYL ALCOHOL 1.4 % OP SOLN
1.0000 [drp] | Freq: Four times a day (QID) | OPHTHALMIC | Status: DC | PRN
  Filled 2019-05-30: qty 15

## 2019-05-30 MED ORDER — SODIUM BICARBONATE 8.4 % IV SOLN
50.0000 meq | Freq: Once | INTRAVENOUS | Status: AC
  Administered 2019-05-30: 50 meq via INTRAVENOUS

## 2019-05-30 MED ORDER — GLYCOPYRROLATE 0.2 MG/ML IJ SOLN: 0.2000 mg | INTRAMUSCULAR | Status: DC | PRN

## 2019-05-30 MED ORDER — SODIUM CHLORIDE 0.9 % IV SOLN
1.0000 g | Freq: Once | INTRAVENOUS | Status: AC
  Administered 2019-05-30: 1 g via INTRAVENOUS
  Filled 2019-05-30: qty 10

## 2019-05-30 MED ORDER — DEXTROSE 50 % IV SOLN
1.0000 | Freq: Once | INTRAVENOUS | Status: AC
  Administered 2019-05-30: 50 mL via INTRAVENOUS

## 2019-05-30 MED ORDER — DIPHENHYDRAMINE HCL 50 MG/ML IJ SOLN: 25.0000 mg | INTRAMUSCULAR | Status: DC | PRN

## 2019-05-30 MED ORDER — SODIUM BICARBONATE 8.4 % IV SOLN
100.0000 meq | Freq: Once | INTRAVENOUS | Status: AC
  Administered 2019-05-30: 07:00:00 100 meq via INTRAVENOUS

## 2019-05-30 MED ORDER — PIPERACILLIN-TAZOBACTAM 3.375 G IVPB 30 MIN
3.3750 g | Freq: Four times a day (QID) | INTRAVENOUS | Status: DC
  Administered 2019-05-30: 3.375 g via INTRAVENOUS
  Filled 2019-05-30 (×4): qty 50

## 2019-05-30 MED ORDER — MORPHINE SULFATE (PF) 2 MG/ML IV SOLN: 2.0000 mg | INTRAVENOUS | Status: DC | PRN

## 2019-05-30 MED ORDER — ACETAMINOPHEN 325 MG PO TABS: 650.0000 mg | ORAL_TABLET | Freq: Four times a day (QID) | ORAL | Status: DC | PRN

## 2019-05-30 MED ORDER — MORPHINE 100MG IN NS 100ML (1MG/ML) PREMIX INFUSION
1.0000 mg/h | INTRAVENOUS | Status: DC
  Administered 2019-05-30: 15:00:00 2 mg/h via INTRAVENOUS
  Filled 2019-05-30: qty 100

## 2019-05-30 MED ORDER — DEXTROSE 5 % IV SOLN: INTRAVENOUS | Status: DC

## 2019-05-30 MED FILL — Medication: Qty: 1 | Status: AC

## 2019-05-31 LAB — AEROBIC CULTURE W GRAM STAIN (SUPERFICIAL SPECIMEN)

## 2019-06-01 LAB — CULTURE, BLOOD (ROUTINE X 2)

## 2019-06-01 LAB — GLUCOSE, CAPILLARY: Glucose-Capillary: 14 mg/dL — CL (ref 70–99)

## 2019-06-02 ENCOUNTER — Encounter: Payer: Self-pay | Admitting: Family Medicine

## 2019-06-03 LAB — CULTURE, BLOOD (ROUTINE X 2): Culture: NO GROWTH

## 2019-06-27 NOTE — Progress Notes (Signed)
PCCM Attending Note  Please refer also to Dr Noni Saupe progress note.   39 year old man with end-stage renal disease associated vasculopathy and difficult access.  He lost his right femoral dialysis access 4/2 associated with 1 L blood loss. He was also hypoglycemic which required treatment.  And the femoral area extending to his pannus showed evidence for cellulitis.  He developed septic shock, obtundation requiring intubation for airway protection.  Pressor needs of escalated.  He had a profound lactic acidosis.  He was started on CVVHD through a new hemodialysis catheter placed in the left IJ. Overnight the left IJ CVC became nonfunctional.  Given his urgent need and critical condition a new HD catheter was placed by IR in the middle the night.  This was unfortunately associated with a cardiac arrest with CPR that lasted 12 minutes, progressive lactic acidosis, escalating pressor needs. This morning FiO2 0.40, PEEP 5, respiratory rate 30 he has 1 of 2 blood cultures positive for GPC and is on broad-spectrum antibiotics.  Remains profoundly acidotic, pH 7.1 lactic acid greater than 11.  Vitals:   2019/06/06 1200 06/06/19 1300 06/06/2019 1400 2019/06/06 1430  BP:      Pulse: (!) 161   (!) 138  Resp: (!) 26 (!) 27 (!) 35 (!) 26  Temp:      TempSrc:      SpO2: (!) 60% (!) 43% (!) 48% (!) 55%  Weight:      Height:      Ill-appearing obese man, critically ill with moderate respiratory distress on mechanical ventilation.  He is completely unresponsive to voice, pain, any stimulus.  His heart is tachycardic, regular without a murmur.  Lungs are coarse bilaterally without wheezing.  His abdomen is obese, slightly protuberant, no bowel sounds heard.  He has mottled lower extremities, with bilateral lower extremity edema.  He has erosions on his pannus that extend to the femoral area and through his groin down to the buttocks.  Profound septic shock and likely superimposed cardiogenic shock exacerbated by  lactic acidosis following cardiopulmonary arrest.  He is on broad-spectrum antibiotics, vancomycin, Zosyn, fluconazole.  He is receiving volume resuscitation.  Titrating pressors.  He is also on stress dose hydrocortisone.  Despite all of these interventions we do not have hemodynamic stability.  I discussed with the patient's family at bedside and by phone his status and our failure to achieve stability.  I have told her that unfortunately I do not believe he will survive and he will continue to progressively worsen.  His sister and brother both understandably saddened and upset but do not want him to suffer if he cannot recover.  Based on this DNR orders were placed and his current care will be continued.  Peter Congo will let me know if and when they are ready to transition to full comfort care and compassionate extubation.  Independent critical care time 45 minutes.  Baltazar Apo, MD, PhD 06/06/19, 3:42 PM [facility] [phone] or if no answer [phone]

## 2019-06-27 NOTE — Progress Notes (Addendum)
PHARMACY - PHYSICIAN COMMUNICATION CRITICAL VALUE ALERT - BLOOD CULTURE IDENTIFICATION (BCID)  John Petty is an 39 y.o. male who presented to [facility] on 06/12/2019 with a chief complaint of bleeding from his right groin.  Assessment:  Started on vancomycin and Zosyn for sepsis, and fluconazole for yeast growing in both groins and under the breasts.  Blood culture growing 1 of 2 GPC in chain.  Currently on 3 pressors.  Name of physician (or Provider) Contacted: Dr. Lamonte Sakai  Current antibiotics: vancomycin, Zosyn, fluconazole  Changes to prescribed antibiotics recommended:  Patient is on recommended antibiotics - No changes needed  Continue broad spectrum abx for septic shock, critically ill [place] as able  [name], PharmD, BCPS, [place] 06-03-19, 9:59 AM

## 2019-06-27 NOTE — Death Summary Note (Signed)
  Name: John Petty MRN: [id number] DOB: 10/28/1980 39 y.o.  Date of Admission: 06/10/2019 10:52 PM Date of Discharge: 13-Jun-2019 Attending Physician: Candee Furbish, MD  Discharge Diagnosis: Active Problems:   Acute blood loss anemia   Cardiac arrest [facility])   Cause of death: Refractory shock Time of death: 1520  Disposition and follow-up:   John Petty was discharged from [facility] in expired condition.    Hospital Course: John Petty is a 39 year-old male with a medical history of ESRD and HTN who presented with acute onset bleeding from R groin after his HD catheter was suddenly removed after getting caught on a handrail. In the ED, he was found to be altered, hypotensive, and with purulent drainage from HD cath site. He was admitted to the ICU for pressor support, intubation, and IV antibiotic therapy. He suffered a PEA arrest after replacement of HD cath by IR. He was coded for 15 minutes before ROSC. Unfortunately, he remained hemodynamically unstable after this despite support with 3 pressors, ventilator, and CRRT. Family decided to transitioned to comfort care after [place] discussion and patient was terminally extubated at 1505. He expired promptly after extubation.   Signed: Welford Roche, MD 2019/06/13, 3:27 PM

## 2019-06-27 NOTE — Progress Notes (Addendum)
[facility] Progress Note  Subjective: on CRRT, had cardiac arrest last night. Was hyperkalemic w/ K 7.0, now down to 6.3 , severely acidemic.  Had [facility] R groin placed by IR yest afternoon.   Vitals:   Jun 08, 2019 0816 Jun 08, 2019 0900 06/08/19 1000 Jun 08, 2019 1100  BP:  110/66    Pulse:  (!) 117    Resp:  (!) 29 (!) 25 (!) 27  Temp:      TempSrc:      SpO2: (!) 86% (!) 51%    Weight:      Height:        Exam: Obese AAM, on vent, eyes open w/o contact or tracking  neck no jvd   L IJ temp cath    Chest no rales or wheezing   Cor RRR no RG   Abd obese, large pannus w/ excoriated areas underneath    R groin TDC running w/ cRRT    Excoriated bleeding ares R / L groin, pannus    Mild peripheral edema    Neuro not responding     Dialysis: SW TTS     4.5h  400/800   121kg  1K/2.5Ca   Hep 12000   - hect 8, mircera 75 last 3/30, venofer 50 q wk   Assessment/ Plan: 1. ABLA due to dislodged R fem TDC - Hb 8.0 > 9.5 after 1u prbc Oneg. Pt in shock on mult pressors, sp cardiac arrest last night w/ 53mn CPR.  Prognosis poor. 2. SP cardiac arrest - 4/2, last night   3. ESRD - usual TTS HD, on CRRT at this time.  Changed to pre/post NaHCO3 fluids d/t severe acidemia. Not able to pull any fluid due to severe shock.   4. Poor HD access - has had multiple catheters that failed.  5. Hyperkalemia - improving, using all zero K fluids.  6. BP/ volume - left last HD 10kg over edw. CXR here okay though. 10kg up by wts, unable to pull due to shock.  7. Shock - prob sepsis due to cath infection.  Dislodged cath is likely due to infection as well.  On empiric IV abx.  +GPC on blood cx's, ID pend   Rob Anacaren Kohan 42021-04-12 12:48 PM   Recent Labs  Lab 05/29/19 1543 05/29/19 1822 [id number] 004/12/[id number]  K 6.4*   < > 6.2*   < > 6.0*   < > 5.3*   < > 6.3* 6.4*  BUN 76*   < > 55*  --   --   --    --   --   --  39*  CREATININE 12.26*   < > 8.34*  --   --   --   --   --   --  5.79*  CALCIUM 8.3*   < > 8.8*  --   --   --   --   --   --  8.4*  PHOS 8.9*  --  8.4*  --   --   --   --   --   --   --   HGB 9.6*   < > 9.3*   < > 10.2*  --  9.5*  --   --   --    < > = values in this interval not displayed.   Inpatient medications: . chlorhexidine gluconate (MEDLINE KIT)  15 mL Mouth Rinse BID  . Chlorhexidine Gluconate  Cloth  6 each Topical [id number]  . feeding supplement (PRO-STAT SUGAR FREE 64)  60 mL Per Tube TID  . hydrocortisone sod succinate (SOLU-CORTEF) inj  50 mg Intravenous Q6H  . insulin aspart  0-6 Units Subcutaneous Q4H  . mouth rinse  15 mL Mouth Rinse 10 times per day  . pantoprazole (PROTONIX) IV  40 mg Intravenous BID  . sevelamer carbonate  2.4 g Per Tube 5 X Daily  . sodium bicarbonate      . sodium chloride flush  10-40 mL Intracatheter Q12H  . sodium chloride flush  3 mL Intravenous Q12H  . sodium polystyrene  15 g Oral Once   . sodium chloride Stopped (05/29/19 1828)  . dextrose 50 mL/hr at 06-14-19 0900  . epinephrine 10 mcg/min (2019-06-14 1131)  . feeding supplement (VITAL 1.5 CAL) Stopped (05/29/19 1340)  . fentaNYL infusion INTRAVENOUS Stopped (05/29/19 2336)  . fluconazole (DIFLUCAN) IV    . morphine    . norepinephrine (LEVOPHED) Adult infusion 50 mcg/min (14-Jun-2019 1000)  . piperacillin-tazobactam    . prismasol BGK 0/2.5 2,300 mL/hr at June 14, 2019 1018  .  sodium bicarbonate  infusion 1000 mL 125 mL/hr at 14-Jun-2019 0900  . sodium bicarbonate (isotonic) 1000 mL infusion 100 mL/hr at 2019/06/14 1125  . sodium bicarbonate (isotonic) 1000 mL infusion 100 mL/hr at Jun 14, 2019 1122  . vancomycin    . vasopressin (PITRESSIN) infusion - *FOR SHOCK* 0.05 Units/min (2019-06-14 0800)   sodium chloride, acetaminophen **OR** acetaminophen, fentaNYL, gelatin adsorbable, heparin, heparin, lidocaine, midazolam, ondansetron **OR** ondansetron (ZOFRAN) IV, senna-docusate, sodium  chloride flush, sodium chloride flush

## 2019-06-27 NOTE — Progress Notes (Signed)
Pt expired at 1520 verified by two RNs per protocol. MD aware. CDS will be called per elink. Emotional support provided to pt family at bedside. Patient placement card given to sister to call with funeral home information. Heart beat in bottle provided. Post mortum care to be completed.

## 2019-06-27 NOTE — Progress Notes (Signed)
CRITICAL VALUE ALERT  Critical Value:  K-6.3  Date & Time Notied:  2019-06-27 1006  Provider Notified: Dr. Lamonte Sakai  Orders Received/Actions taken: Reported to [place].  MD aware.  No new orders Irven Baltimore, RN

## 2019-06-27 NOTE — Progress Notes (Signed)
   06-28-2019 1026  Clinical Encounter Type  Visited With Patient and family together;Health care provider  Visit Type Initial;Spiritual support;Critical Care  Referral From Nurse  Spiritual Encounters  Spiritual Needs Prayer;Emotional  Stress Factors  Family Stress Factors Health changes   Chaplain responded to a request from the nurse to visit with the patient and his sister. Patient's sister has had a long night driving up to the hospital, and is here by herself (potentially another family member on the way).   Chaplain facilitated a brief life review, and was present during a medical consultation with the MD.  Chaplain offered prayer and support. Chaplain extended hospitality. Chaplain introduced spiritual care services. Spiritual care services available as needed.   Jeri Lager, Chaplain

## 2019-06-27 NOTE — Progress Notes (Signed)
eLink Physician-Brief Progress Note Patient Name: John Petty DOB: [birth year] MRN: [id number]   Date of Service  06-25-2019  HPI/Events of Note  Notified of ABG. Patient currently on PRVC VT 540, RR 26, peep 5, Fio2 40. Also on IV bicarb and on CRRT.   eICU Interventions  Increase RR to 30 Would like to maintain VT not more than 8 cc/kg IBW but was very acidotic and had a cardiac arrest earlier so will check next ABG and reduce VT if able Repeat ABG at 5 am  Please call us back with results      Intervention Category Major Interventions: Respiratory failure - evaluation and management  Margaretmary Lombard 06/25/19, 3:14 AM

## 2019-06-27 NOTE — Procedures (Signed)
Extubation Procedure Note  Patient Details:   Name: Ender Rorke DOB: 1981/02/10 MRN: [id number]   Airway Documentation:    Vent end date: 06-25-2019 Vent end time: 1505   Evaluation  O2 sats: currently acceptable Complications: No apparent complications Patient did tolerate procedure well. Bilateral Breath Sounds: Rhonchi, Diminished   No   Terminal extubation   Aaryanna Hyden 06-25-19, 3:09 PM

## 2019-06-27 NOTE — Progress Notes (Signed)
Panic ABG values given to Dr. Charleen Kirks @ [phone]

## 2019-06-27 NOTE — Progress Notes (Addendum)
eLink Physician-Brief Progress Note Patient Name: John Petty DOB: 03-10-1980 MRN: [id number]   Date of Service  06-09-19  HPI/Events of Note  Notified of ABG Already with RR at 30, and higher than allowed VT, doubt further vent changes can be made  K is 6.0 on labs, RN already spoke with renal and changes were made to CRRT Continues to remain acidotic even while on CRRT and on IV bicarbonate  eICU Interventions  Push 1 amp of bicarbonate Check lactate Repeat ABG at 7.30 am Of note, patient remains on 3 pressors, but epinephrine dose is reducing      Intervention Category Major Interventions: Acute renal failure - evaluation and management;Delirium, psychosis, severe agitation - evaluation and management  Darriana Deboy G Sharmel Ballantine 2019-06-09, 5:29 AM   6.57 am - Lactate is still pending in system Signed out to incoming MD, updated him of overnight events

## 2019-06-27 NOTE — Progress Notes (Signed)
NAME:  John Petty, MRN:  [id number], DOB:  [birth year], LOS: 1 ADMISSION DATE:  06/08/2019, CONSULTATION DATE:  05/29/19 REFERRING MD:  Maren Beach, CHIEF COMPLAINT:  AMS, hypotension   Brief History   39 year old man with ESRD on HD, longterm vascular access issues presenting with hypoglycemia and AMS.  History of present illness   39 year old man with ESRD on HD, longterm vascular access issues presenting after his right tunneled femoral HD catheter ripped out.  He was found to have signs of infection at his catheter site and was admitted for further workup of presumed sepsis.  This AM, he became progressively obtunded, hypoglycemic so PCCM consulted for further management.  History per chart review due to obtundation.  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 Right ventricular function intact.   Micro Data:  Blood cultures (4/2): GPC in chains   Antimicrobials:  Vancomycin 4/2 >> Zosyn 4/2 >> Fluconazole 4/2 >>  Interim history/subjective:   Patient went into cardiac arrest overnight after returning from IR suite for right femoral tunnel cath placement. ROSC was obtained after 12 minutes. He subsequently developed significant acidosis.   Objective   Blood pressure 127/62, pulse (!) 122, temperature 97.6 F (36.4 C), temperature source Axillary, resp. rate (!) 30, height 5\' 4"  (1.626 m), weight 131.3 kg, SpO2 (!) 86 %.    Vent Mode: PRVC FiO2 (%):  [40 %-100 %] 40 % Set Rate:  [20 bmp-30 bmp] 30 bmp Vt Set:  [540 mL] 540 mL PEEP:  [5 cmH20] 5 cmH20 Plateau Pressure:  [19 cmH20-24 cmH20] 22 cmH20   Intake/Output Summary (Last 24  hours) at 06/22/19 0749 Last data filed at 06-22-2019 0700 Gross per 24 hour  Intake 4345.56 ml  Output 2353 ml  Net 1992.56 ml   Filed Weights   05/29/19 0000 22-Jun-2019 0500  Weight: 122.1 kg 131.3 kg    Examination: General: Obese, male. Sedated.  HENT: ETT in place with dried blood in the oropharynx Lungs: Decreased breathe sounds diffusely. No rales or wheezes auscultated.  Cardiovascular: Distant heart sounds. Tachycardiac with regular rhythm.  Abdomen: Absent bowel sounds with  Extremities: Bilateral upper and lower extremities cold to touch. Plantar surface of bilateral feet are mottled and cyanotic.  Neuro: Sedated. Unresponsive to voice or painful stimuli. PERRL, although sluggish  Skin: Erythematous lesions spanning pannus.   Resolved Hospital Problem list   None  Assessment & Plan:   Shock - Septic Cardiac Arrest  - Likely source of infection is patient's abdominal pannus that involved of the prior right femoral dialysis catheter. Blood cultures 1/2 growing GPC in chains. Second set of blood cultures still pending.  - Complicated by cardiac arrest early on 4/2. ROSC obtained after 12 minutes per note.  - Started on Levo, Epi and Vasopressin.  - Started on Vancomycin, Zosyn, and Diflucan - Stress dosing of Hydrocortisone initiated  - Clinical status continued to rapidly decline with worsening hypotension despite maximum pressors, Indicating refractory shock. Continued to be unresponsive even with painful stimuli. Pupils no longer reactive. In light of co-morbidities with severe metabolic disturbances that have been unable to  be corrected and sepsis, unlikely patient would make a meaningful recovery. Discussed case with family and transition to DNR. After family was able to come to bedside, patient was changed to comfort care and expired within minutes.   Acute Respiratory Failure  - Intubated during cardiac arrest on 4/2  - Secondary to septic shock, exacerbated by  metabolic acidosis - Worsening hypoxia noted even with intubation.   Acute Toxic, Metabolic Encephalopathy Hypoxic brain injury  - Initially secondary to metabolic acidosis, exacerbated by cardiac arrest.  - Pupils were equal and round but sluggish initially. After continued hypoxia, pupils became uneven and non-reactive to light.   Anion Gap Metabolic Acidosis - Baseline elevated bicarb given history of ESRD. Increasing lactic acidosis with  - Sodium Bicarb gtt with intermittent boluses  ESRD  Hyperkalemia  - CRRT initiated with right TDC placed by IR.  - s/p 1 time dose of Kayexalate on 4/2   Upper GI Bleed  - Likely traumatic in light of NG tube placement versus secondary to CPR versus stress ulcer.  Hypoglycemia  - May be secondary shock liver. Given rapid decline in clinical status, LFTs were not obtained.  - D50 amps PRN for blood glucose <50  - D10 @ 50 cc/hr   Best practice:  Diet: N/A Pain/Anxiety/Delirium protocol (if indicated): N/A VAP protocol (if indicated): N/A DVT prophylaxis: N/A GI prophylaxis: N/A Glucose control: N/A Mobility: N/A Code Status: DNR Family Communication: Family updated at bedside  Disposition: Expired   Labs   CBC: Recent Labs  Lab 06/12/2019 2334 06/10/2019 2343 05/29/19 0752 05/29/19 1207 05/29/19 1543 05/29/19 1822 2019/06/04 0006 2019/06/04 0146 04-Jun-2019 0449 06-04-19 0506  WBC 25.2*  --  17.8*  --  11.1*  --  16.4*  --  24.2*  --   NEUTROABS 22.6*  --   --   --   --   --   --   --   --   --   HGB 8.0*   < > 9.7*   < > 9.6*  --  7.8* 9.9* 9.3* 10.2*  HCT 26.6*   < > 31.5*   < > 29.7*  --  25.2* 29.0* 30.7* 30.0*  MCV 97.4  --  92.9  --  90.8  --  93.3  --  94.5  --   PLT 200   < > 173  --  NOTPL PLATELET CLUMPS NOTED ON SMEAR, UNABLE TO ESTIMATE 82*  --  77*  --    < > = values in this interval not displayed.    Basic Metabolic Panel: Recent Labs  Lab 05/29/19 0752 05/29/19 1207 05/29/19 1543 05/29/19 1543 05/29/19 1822  06-04-19 0006 June 04, 2019 0146 06-04-2019 0449 Jun 04, 2019 0506  NA 138  139   < > 136   < > 136 144 139 140 137  K 6.1*  6.2*   < > 6.4*   < > 6.9* 7.1* 5.9* 6.2* 6.0*  CL 97*  99  --  100  --  99 100  --  98  --   CO2 16*  16*  --  18*  --  16* 19*  --  14*  --   GLUCOSE 85  90  --  126*  --  148* 194*  --  102*  --   BUN 71*  72*  --  76*  --  72* 70*  --  55*  --   CREATININE 12.86*  12.80*  --  12.26*  --  11.74* 10.80*  --  8.34*  --   CALCIUM 8.8*  8.7*  --  8.3*  --  8.4* 10.6*  --  8.8*  --   MG 1.9  --  2.0  --   --   --   --  2.5*  --   PHOS 10.1*  9.9*  --  8.9*  --   --   --   --  8.4*  --    < > = values in this interval not displayed.   GFR: Estimated Creatinine Clearance: 14.8 mL/min (A) (by C-G formula based on SCr of 8.34 mg/dL (H)). Recent Labs  Lab 05/29/19 0752 05/29/19 1543 05/29/19 1751 06-10-2019 0006 06/10/19 0449 06/10/19 0547  WBC 17.8* 11.1*  --  16.4* 24.2*  --   LATICACIDVEN  --  3.4* 2.9*  --   --  >11.0*    Liver Function Tests: Recent Labs  Lab 05/29/19 0752 05/29/19 1543 2019/06/10 0449  ALBUMIN 2.0* 1.8* 1.7*   No results for input(s): LIPASE, AMYLASE in the last 168 hours. No results for input(s): AMMONIA in the last 168 hours.  ABG    Component Value Date/Time   PHART 7.191 (LL) 06-10-2019 0506   PCO2ART 42.1 10-Jun-2019 0506   PO2ART 87.0 06-10-19 0506   HCO3 16.2 (L) 10-Jun-2019 0506   TCO2 17 (L) Jun 10, 2019 0506   ACIDBASEDEF 11.0 (H) 06/10/2019 0506   O2SAT 94.0 06/10/19 0506     Coagulation Profile: Recent Labs  Lab 06/24/2019 2334 06-10-19 0006  INR 1.8* 1.6*    Cardiac Enzymes: No results for input(s): CKTOTAL, CKMB, CKMBINDEX, TROPONINI in the last 168 hours.  HbA1C: Hgb A1c MFr Bld  Date/Time Value Ref Range Status  05/29/2019 07:52 AM 6.2 (H) 4.8 - 5.6 % Final    Comment:    (NOTE) Pre diabetes:          5.7%-6.4% Diabetes:              >6.4% Glycemic control for   <7.0% adults with diabetes      CBG: Recent Labs  Lab 05/29/19 1542 05/29/19 1932 05/29/19 2341 2019/06/10 0008 June 10, 2019 0414  GLUCAP 112* 141* 205* 143* 105*    Review of Systems:   Unable to be obtained   Past Medical History  He,  has a past medical history of Chronic kidney disease, Complication of anesthesia, History of blood transfusion, and Hypertension.   Surgical History    Past Surgical History:  Procedure Laterality Date  . ARTERIOVENOUS GRAFT PLACEMENT    . AV FISTULA PLACEMENT    . AV FISTULA PLACEMENT Left 05/07/2018   Procedure: INSERTION OF ARTERIOVENOUS (AV) GORE-TEX GRAFT LEFT THIGH;  Surgeon: Serafina Mitchell, MD;  Location: [place];  Service: Vascular;  Laterality: Left;  . AVF    . INSERTION OF DIALYSIS CATHETER    . IR FLUORO GUIDE CV LINE LEFT  12/25/2018  . THROMBECTOMY AND REVISION OF ARTERIOVENTOUS (AV) GORETEX  GRAFT Left 05/15/2018   Procedure: THROMBECTOMY OF ARTERIOVENTOUS (AV) GORETEX  GRAFT LEFT THIGH;  Surgeon: Serafina Mitchell, MD;  Location: [place];  Service: Vascular;  Laterality: Left;  . TONSILLECTOMY       Social History   reports that he has never smoked. He has never used smokeless tobacco. He reports current alcohol use. He reports previous drug use.   Family History   His family history includes CVA in his father and mother; Heart disease in his father and mother;  Kidney disease in his father.   Allergies No Known Allergies   Home Medications  Prior to Admission medications   Medication Sig Start Date End Date Taking? Authorizing Provider  oxyCODONE-acetaminophen (PERCOCET/ROXICET) 5-325 MG tablet Take 1 tablet by mouth every 6 (six) hours as needed. Patient not taking: Reported on 05/29/2019 05/15/18   Ulyses Amor, PA-C  sevelamer carbonate (RENVELA) 800 MG tablet Take 2,400-3,200 mg by mouth See admin instructions. Take 4 tablets (3200 mg) by mouth with each meal & take 3 tablets (2400 mg) by mouth with snacks    [provider]    Dr. Jose Persia Internal Medicine PGY-1  Pager: [phone] Jun 27, 2019, 7:49 AM

## 2019-06-27 NOTE — Progress Notes (Signed)
CRITICAL VALUE ALERT  Critical Value:  K-6.4  Date & Time Notied:  06-25-19 1220  Provider Notified: Dr. Tamala Julian  Orders Received/Actions taken: No new orders.   Irven Baltimore, RN

## 2019-06-27 DEATH — deceased

## 2021-02-10 IMAGING — CT CT HEAD W/O CM
4 series · 17 of 47 positions shown, 19 images · non-contrast
Comparison: None.

CLINICAL DATA: Speech difficulty

EXAM:
CT HEAD WITHOUT CONTRAST
TECHNIQUE: Contiguous axial images were obtained from the base of the skull
through the vertex without intravenous contrast.

[Series 3: head without · axial · non-contrast · 0.44mm/px · z∈[-179,-44]mm · 7 of 37 slices shown, 9 images]
[im 5/37  brain]
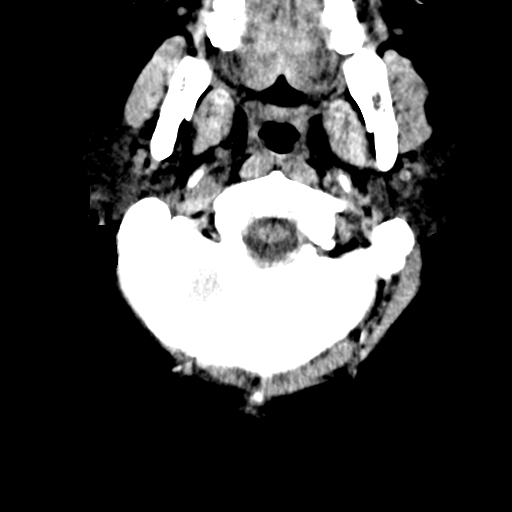
[im 5/37  bone]
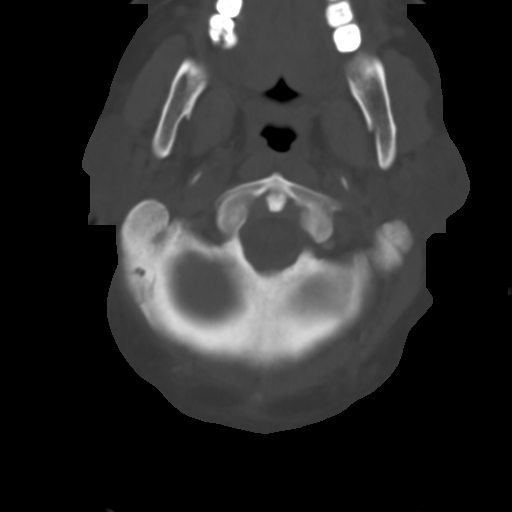
[im 10/37  brain]
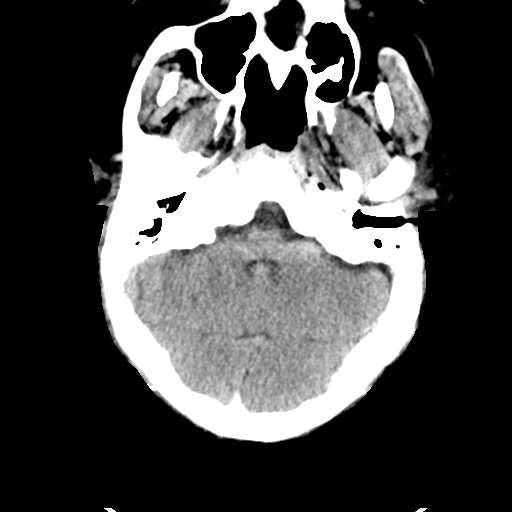
[im 14/37  brain]
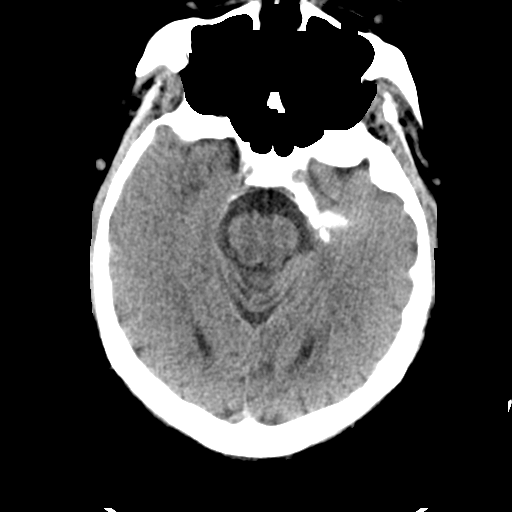
[im 19/37  brain]
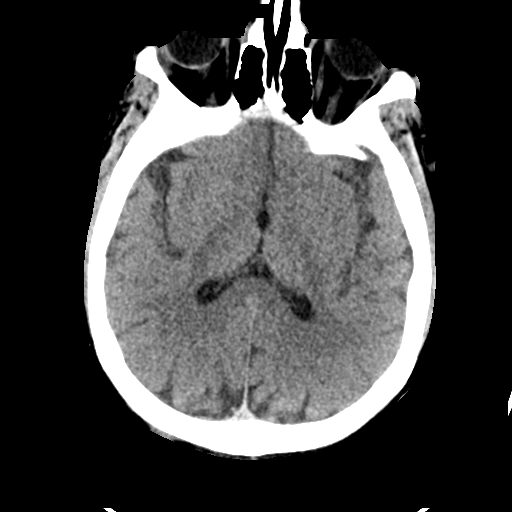
[im 23/37  brain]
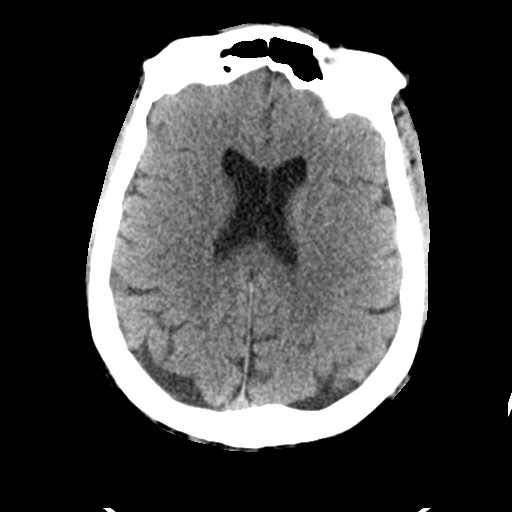
[im 23/37  bone]
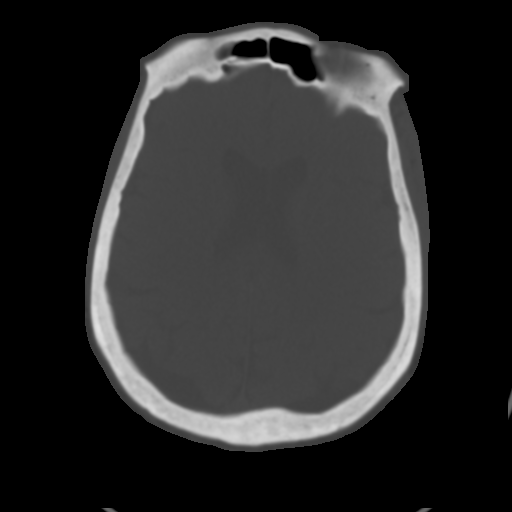
[im 28/37  brain]
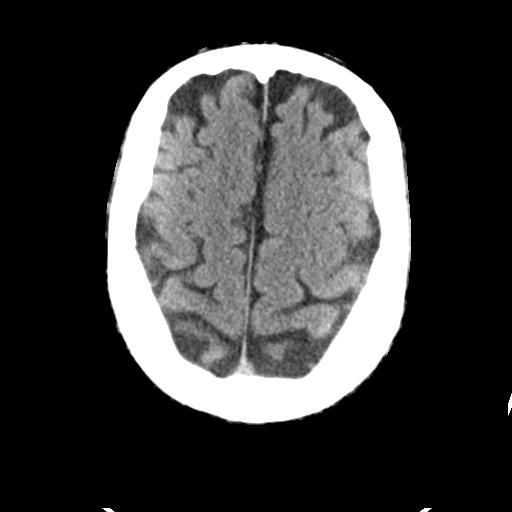
[im 32/37  brain]
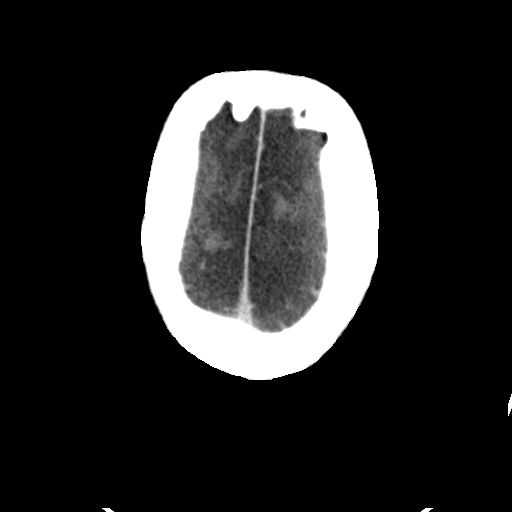

[Series 4: head bone · axial · 0.44mm/px · z∈[-181,-117]mm · 4 of 93 slices shown]
[im 10/93  bone]
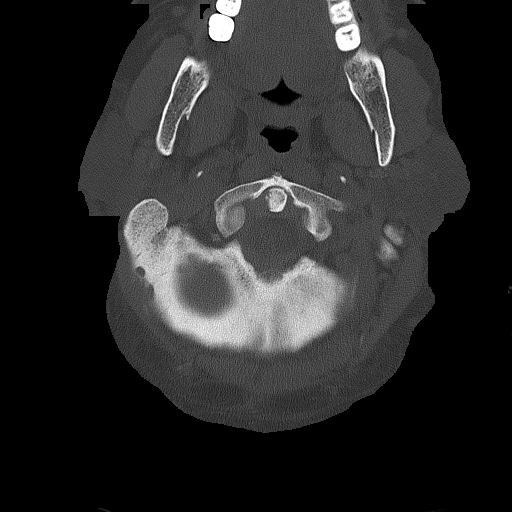
[im 19/93  bone]
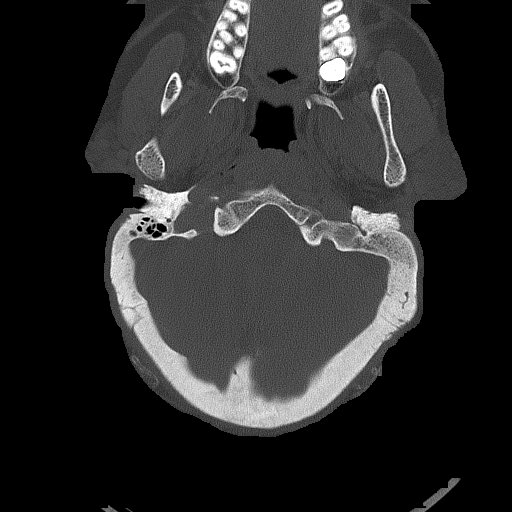
[im 28/93  bone]
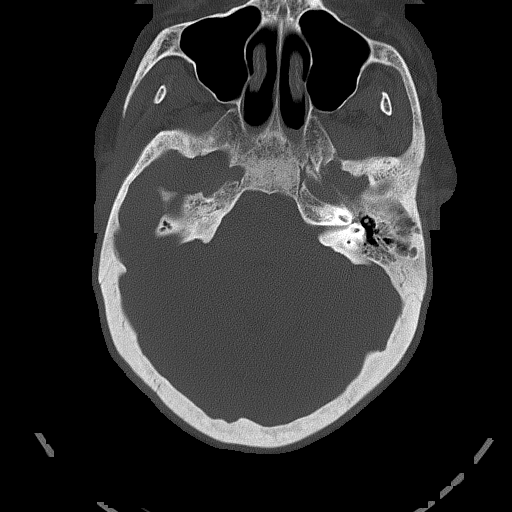
[im 42/93  bone]
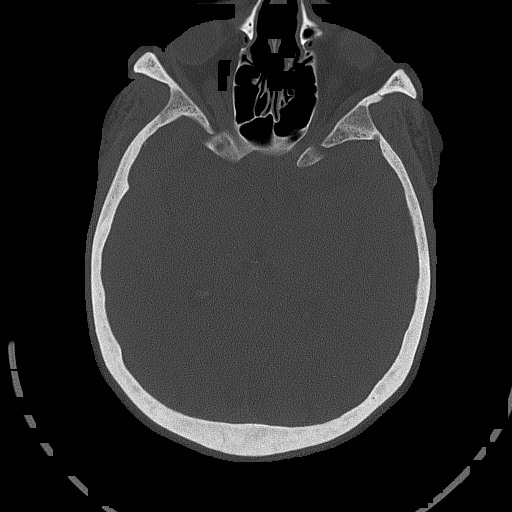

[Series 5: head without cor · coronal · non-contrast · 0.36mm/px · 3 of 70 slices shown]
[im 24/70  brain]
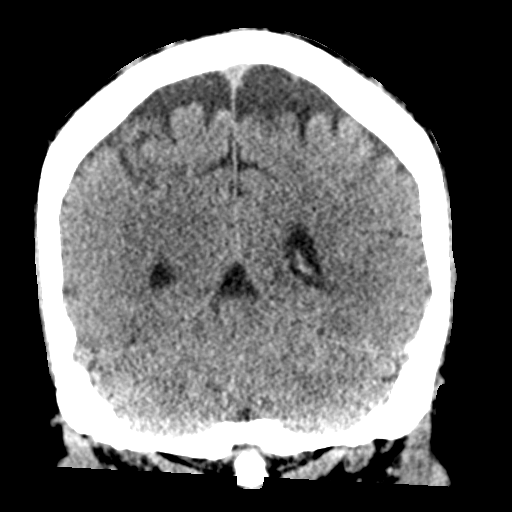
[im 31/70  brain]
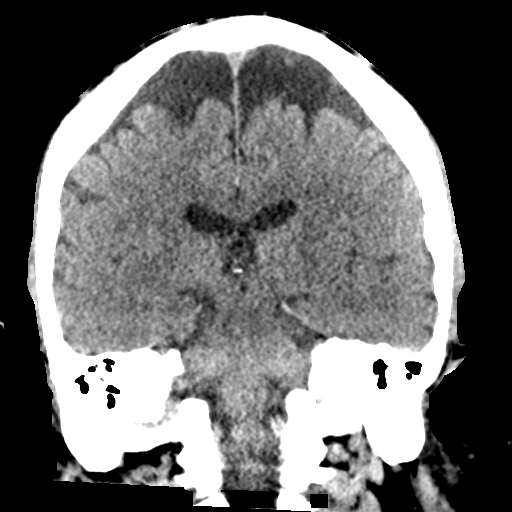
[im 39/70  brain]
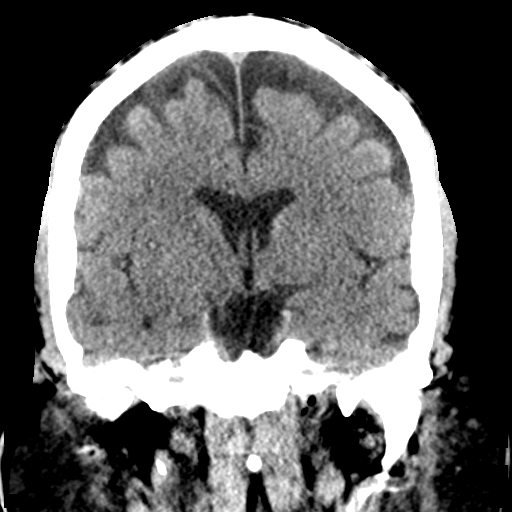

[Series 6: head without sag · sagittal · non-contrast · 0.36mm/px · 3 of 62 slices shown]
[im 21/62  brain]
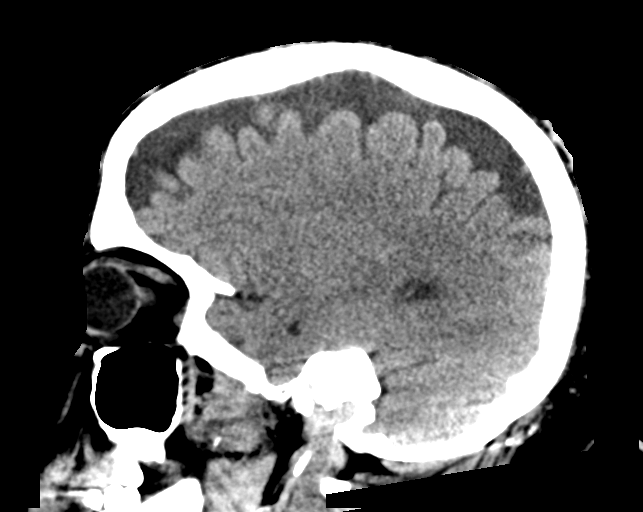
[im 31/62  brain]
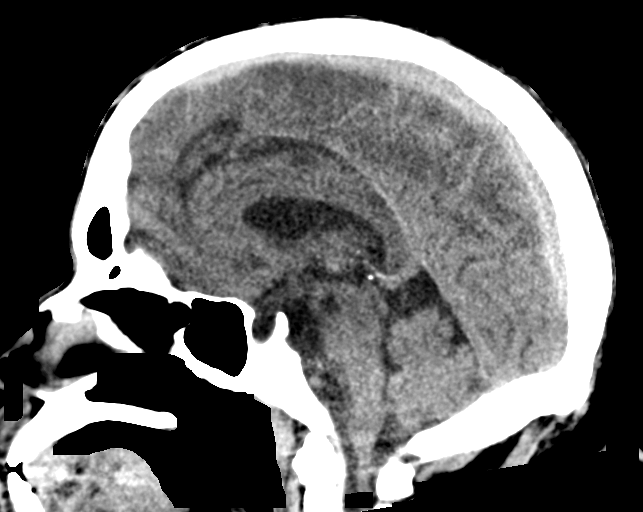
[im 41/62  brain]
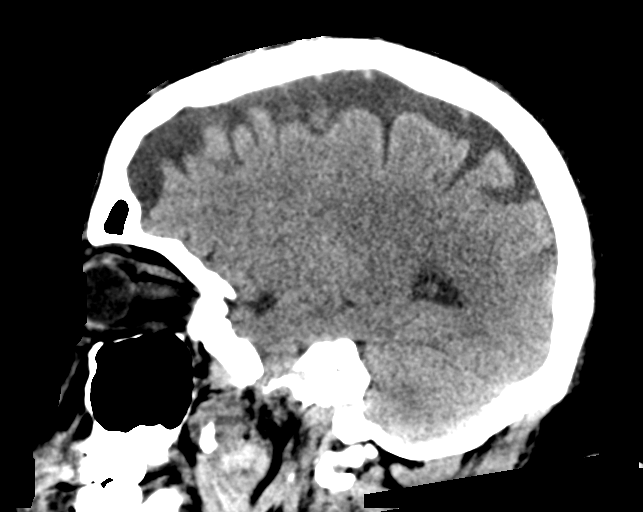

[17 of 47 positions shown; findings below may reference images not displayed]

FINDINGS: Brain: No evidence of acute infarction, hemorrhage, hydrocephalus,
extra-axial collection or mass lesion/mass effect.

Global cortical atrophy.

Vascular: No hyperdense vessel or unexpected calcification.

Skull: Normal. Negative for fracture or focal lesion. Suspected
renal osteodystrophy.

Sinuses/Orbits: The visualized paranasal sinuses are essentially
clear. The mastoid air cells are unopacified.

Other: None.
IMPRESSION: No evidence of acute intracranial abnormality.

Global cortical atrophy.
